# Patient Record
Sex: Female | Born: 1945 | Race: Black or African American | Hispanic: No | Smoking: Never smoker
Health system: Southern US, Community
[De-identification: ages and names within clinical notes are randomized; demographics above are authoritative.]

## PROBLEM LIST (undated history)

## (undated) DIAGNOSIS — B351 Tinea unguium: Secondary | ICD-10-CM

## (undated) DIAGNOSIS — K449 Diaphragmatic hernia without obstruction or gangrene: Secondary | ICD-10-CM

## (undated) DIAGNOSIS — E559 Vitamin D deficiency, unspecified: Secondary | ICD-10-CM

## (undated) DIAGNOSIS — E785 Hyperlipidemia, unspecified: Secondary | ICD-10-CM

## (undated) DIAGNOSIS — E049 Nontoxic goiter, unspecified: Secondary | ICD-10-CM

## (undated) DIAGNOSIS — M545 Low back pain, unspecified: Secondary | ICD-10-CM

## (undated) DIAGNOSIS — Z283 Underimmunization status: Secondary | ICD-10-CM

## (undated) DIAGNOSIS — I10 Essential (primary) hypertension: Secondary | ICD-10-CM

## (undated) DIAGNOSIS — L989 Disorder of the skin and subcutaneous tissue, unspecified: Secondary | ICD-10-CM

## (undated) DIAGNOSIS — Z6827 Body mass index (BMI) 27.0-27.9, adult: Secondary | ICD-10-CM

## (undated) DIAGNOSIS — R079 Chest pain, unspecified: Secondary | ICD-10-CM

## (undated) DIAGNOSIS — K219 Gastro-esophageal reflux disease without esophagitis: Secondary | ICD-10-CM

## (undated) DIAGNOSIS — Z2839 Other underimmunization status: Secondary | ICD-10-CM

## (undated) HISTORY — DX: Essential (primary) hypertension: I10

## (undated) HISTORY — DX: Gastro-esophageal reflux disease without esophagitis: K21.9

## (undated) HISTORY — DX: Low back pain: M54.5

## (undated) HISTORY — DX: Other underimmunization status: Z28.39

## (undated) HISTORY — DX: Hyperlipidemia, unspecified: E78.5

## (undated) HISTORY — DX: Diaphragmatic hernia without obstruction or gangrene: K44.9

## (undated) HISTORY — DX: Low back pain, unspecified: M54.50

## (undated) HISTORY — DX: Disorder of the skin and subcutaneous tissue, unspecified: L98.9

## (undated) HISTORY — DX: Tinea unguium: B35.1

## (undated) HISTORY — DX: Underimmunization status: Z28.3

## (undated) HISTORY — PX: OTHER SURGICAL HISTORY: SHX169

## (undated) HISTORY — DX: Chest pain, unspecified: R07.9

## (undated) HISTORY — DX: Nontoxic goiter, unspecified: E04.9

## (undated) HISTORY — PX: GALLBLADDER SURGERY: SHX652

## (undated) HISTORY — DX: Vitamin D deficiency, unspecified: E55.9

## (undated) HISTORY — DX: Body mass index (BMI) 27.0-27.9, adult: Z68.27

---

## 1997-11-05 ENCOUNTER — Ambulatory Visit (HOSPITAL_COMMUNITY): Admission: RE | Admit: 1997-11-05 | Discharge: 1997-11-05 | Payer: Self-pay | Admitting: Internal Medicine

## 1998-01-03 ENCOUNTER — Ambulatory Visit (HOSPITAL_COMMUNITY): Admission: RE | Admit: 1998-01-03 | Discharge: 1998-01-03 | Payer: Self-pay | Admitting: Gastroenterology

## 1998-02-27 ENCOUNTER — Other Ambulatory Visit: Admission: RE | Admit: 1998-02-27 | Discharge: 1998-02-27 | Payer: Self-pay | Admitting: *Deleted

## 1998-03-14 ENCOUNTER — Ambulatory Visit (HOSPITAL_COMMUNITY): Admission: RE | Admit: 1998-03-14 | Discharge: 1998-03-14 | Payer: Self-pay | Admitting: *Deleted

## 1998-03-14 ENCOUNTER — Encounter: Payer: Self-pay | Admitting: *Deleted

## 1998-03-27 ENCOUNTER — Encounter: Payer: Self-pay | Admitting: Internal Medicine

## 1998-03-27 ENCOUNTER — Ambulatory Visit (HOSPITAL_COMMUNITY): Admission: RE | Admit: 1998-03-27 | Discharge: 1998-03-27 | Payer: Self-pay | Admitting: Internal Medicine

## 1998-10-25 ENCOUNTER — Encounter: Payer: Self-pay | Admitting: Internal Medicine

## 1998-10-25 ENCOUNTER — Ambulatory Visit (HOSPITAL_COMMUNITY): Admission: RE | Admit: 1998-10-25 | Discharge: 1998-10-25 | Payer: Self-pay | Admitting: Internal Medicine

## 1999-01-24 ENCOUNTER — Ambulatory Visit (HOSPITAL_COMMUNITY): Admission: RE | Admit: 1999-01-24 | Discharge: 1999-01-24 | Payer: Self-pay | Admitting: Internal Medicine

## 1999-01-24 ENCOUNTER — Encounter: Payer: Self-pay | Admitting: Internal Medicine

## 1999-02-20 ENCOUNTER — Encounter (HOSPITAL_BASED_OUTPATIENT_CLINIC_OR_DEPARTMENT_OTHER): Payer: Self-pay | Admitting: General Surgery

## 1999-02-24 ENCOUNTER — Encounter (HOSPITAL_BASED_OUTPATIENT_CLINIC_OR_DEPARTMENT_OTHER): Payer: Self-pay | Admitting: General Surgery

## 1999-02-24 ENCOUNTER — Encounter (INDEPENDENT_AMBULATORY_CARE_PROVIDER_SITE_OTHER): Payer: Self-pay | Admitting: *Deleted

## 1999-02-25 ENCOUNTER — Inpatient Hospital Stay (HOSPITAL_COMMUNITY): Admission: RE | Admit: 1999-02-25 | Discharge: 1999-02-26 | Payer: Self-pay | Admitting: General Surgery

## 1999-02-25 ENCOUNTER — Encounter (HOSPITAL_BASED_OUTPATIENT_CLINIC_OR_DEPARTMENT_OTHER): Payer: Self-pay | Admitting: General Surgery

## 1999-03-10 ENCOUNTER — Other Ambulatory Visit: Admission: RE | Admit: 1999-03-10 | Discharge: 1999-03-10 | Payer: Self-pay | Admitting: *Deleted

## 1999-03-17 ENCOUNTER — Encounter: Admission: RE | Admit: 1999-03-17 | Discharge: 1999-03-17 | Payer: Self-pay | Admitting: Internal Medicine

## 1999-03-17 ENCOUNTER — Encounter: Payer: Self-pay | Admitting: Internal Medicine

## 1999-07-10 ENCOUNTER — Other Ambulatory Visit: Admission: RE | Admit: 1999-07-10 | Discharge: 1999-07-10 | Payer: Self-pay | Admitting: *Deleted

## 2000-03-19 ENCOUNTER — Encounter: Admission: RE | Admit: 2000-03-19 | Discharge: 2000-03-19 | Payer: Self-pay | Admitting: Internal Medicine

## 2000-03-19 ENCOUNTER — Encounter: Payer: Self-pay | Admitting: Internal Medicine

## 2000-07-01 ENCOUNTER — Other Ambulatory Visit: Admission: RE | Admit: 2000-07-01 | Discharge: 2000-07-01 | Payer: Self-pay | Admitting: *Deleted

## 2001-03-22 ENCOUNTER — Encounter: Payer: Self-pay | Admitting: *Deleted

## 2001-03-22 ENCOUNTER — Encounter: Admission: RE | Admit: 2001-03-22 | Discharge: 2001-03-22 | Payer: Self-pay | Admitting: *Deleted

## 2001-06-14 ENCOUNTER — Encounter: Admission: RE | Admit: 2001-06-14 | Discharge: 2001-06-14 | Payer: Self-pay | Admitting: Internal Medicine

## 2001-06-14 ENCOUNTER — Encounter: Payer: Self-pay | Admitting: Internal Medicine

## 2002-03-23 ENCOUNTER — Encounter: Admission: RE | Admit: 2002-03-23 | Discharge: 2002-03-23 | Payer: Self-pay

## 2002-10-26 ENCOUNTER — Inpatient Hospital Stay (HOSPITAL_COMMUNITY): Admission: EM | Admit: 2002-10-26 | Discharge: 2002-10-27 | Payer: Self-pay | Admitting: *Deleted

## 2002-10-26 ENCOUNTER — Encounter: Payer: Self-pay | Admitting: *Deleted

## 2002-10-27 ENCOUNTER — Encounter (INDEPENDENT_AMBULATORY_CARE_PROVIDER_SITE_OTHER): Payer: Self-pay | Admitting: Cardiology

## 2002-10-27 ENCOUNTER — Encounter: Payer: Self-pay | Admitting: Cardiology

## 2002-11-20 ENCOUNTER — Encounter: Payer: Self-pay | Admitting: *Deleted

## 2002-11-20 ENCOUNTER — Observation Stay (HOSPITAL_COMMUNITY): Admission: EM | Admit: 2002-11-20 | Discharge: 2002-11-21 | Payer: Self-pay | Admitting: *Deleted

## 2003-03-26 ENCOUNTER — Encounter: Admission: RE | Admit: 2003-03-26 | Discharge: 2003-03-26 | Payer: Self-pay | Admitting: Internal Medicine

## 2003-04-16 ENCOUNTER — Ambulatory Visit (HOSPITAL_COMMUNITY): Admission: RE | Admit: 2003-04-16 | Discharge: 2003-04-16 | Payer: Self-pay | Admitting: Gastroenterology

## 2003-09-24 ENCOUNTER — Other Ambulatory Visit: Admission: RE | Admit: 2003-09-24 | Discharge: 2003-09-24 | Payer: Self-pay | Admitting: Obstetrics and Gynecology

## 2004-04-03 ENCOUNTER — Encounter: Admission: RE | Admit: 2004-04-03 | Discharge: 2004-04-03 | Payer: Self-pay | Admitting: Internal Medicine

## 2004-09-30 ENCOUNTER — Other Ambulatory Visit: Admission: RE | Admit: 2004-09-30 | Discharge: 2004-09-30 | Payer: Self-pay | Admitting: Obstetrics and Gynecology

## 2004-10-21 ENCOUNTER — Encounter: Admission: RE | Admit: 2004-10-21 | Discharge: 2004-10-21 | Payer: Self-pay | Admitting: Internal Medicine

## 2005-04-06 ENCOUNTER — Encounter: Admission: RE | Admit: 2005-04-06 | Discharge: 2005-04-06 | Payer: Self-pay | Admitting: Internal Medicine

## 2005-08-15 ENCOUNTER — Emergency Department (HOSPITAL_COMMUNITY): Admission: EM | Admit: 2005-08-15 | Discharge: 2005-08-15 | Payer: Self-pay | Admitting: Emergency Medicine

## 2005-10-09 ENCOUNTER — Other Ambulatory Visit: Admission: RE | Admit: 2005-10-09 | Discharge: 2005-10-09 | Payer: Self-pay | Admitting: Obstetrics and Gynecology

## 2006-04-09 ENCOUNTER — Encounter: Admission: RE | Admit: 2006-04-09 | Discharge: 2006-04-09 | Payer: Self-pay | Admitting: Internal Medicine

## 2006-10-14 ENCOUNTER — Other Ambulatory Visit: Admission: RE | Admit: 2006-10-14 | Discharge: 2006-10-14 | Payer: Self-pay | Admitting: Obstetrics and Gynecology

## 2006-12-08 ENCOUNTER — Encounter: Admission: RE | Admit: 2006-12-08 | Discharge: 2006-12-08 | Payer: Self-pay | Admitting: Internal Medicine

## 2007-04-12 ENCOUNTER — Encounter: Admission: RE | Admit: 2007-04-12 | Discharge: 2007-04-12 | Payer: Self-pay | Admitting: Obstetrics and Gynecology

## 2007-10-26 ENCOUNTER — Other Ambulatory Visit: Admission: RE | Admit: 2007-10-26 | Discharge: 2007-10-26 | Payer: Self-pay | Admitting: Obstetrics and Gynecology

## 2007-12-05 ENCOUNTER — Encounter: Admission: RE | Admit: 2007-12-05 | Discharge: 2007-12-05 | Payer: Self-pay | Admitting: Internal Medicine

## 2007-12-15 ENCOUNTER — Ambulatory Visit (HOSPITAL_COMMUNITY): Admission: RE | Admit: 2007-12-15 | Discharge: 2007-12-15 | Payer: Self-pay | Admitting: Internal Medicine

## 2008-04-02 ENCOUNTER — Emergency Department (HOSPITAL_COMMUNITY): Admission: EM | Admit: 2008-04-02 | Discharge: 2008-04-02 | Payer: Self-pay | Admitting: Emergency Medicine

## 2008-04-17 ENCOUNTER — Encounter: Admission: RE | Admit: 2008-04-17 | Discharge: 2008-04-17 | Payer: Self-pay | Admitting: Internal Medicine

## 2008-10-23 ENCOUNTER — Other Ambulatory Visit: Admission: RE | Admit: 2008-10-23 | Discharge: 2008-10-23 | Payer: Self-pay | Admitting: Obstetrics and Gynecology

## 2009-04-23 ENCOUNTER — Encounter: Admission: RE | Admit: 2009-04-23 | Discharge: 2009-04-23 | Payer: Self-pay | Admitting: Internal Medicine

## 2009-08-21 ENCOUNTER — Encounter: Admission: RE | Admit: 2009-08-21 | Discharge: 2009-08-21 | Payer: Self-pay | Admitting: Internal Medicine

## 2009-10-02 ENCOUNTER — Ambulatory Visit (HOSPITAL_COMMUNITY): Admission: RE | Admit: 2009-10-02 | Discharge: 2009-10-02 | Payer: Self-pay | Admitting: Internal Medicine

## 2009-10-30 ENCOUNTER — Other Ambulatory Visit: Admission: RE | Admit: 2009-10-30 | Discharge: 2009-10-30 | Payer: Self-pay | Admitting: Obstetrics and Gynecology

## 2010-03-21 ENCOUNTER — Other Ambulatory Visit: Payer: Self-pay | Admitting: Internal Medicine

## 2010-03-21 ENCOUNTER — Ambulatory Visit
Admission: RE | Admit: 2010-03-21 | Discharge: 2010-03-21 | Disposition: A | Discharge status: Home / Self Care | Payer: BC Managed Care – PPO | Source: Ambulatory Visit | Attending: Internal Medicine | Admitting: Internal Medicine

## 2010-03-21 DIAGNOSIS — M25532 Pain in left wrist: Secondary | ICD-10-CM

## 2010-04-16 ENCOUNTER — Other Ambulatory Visit: Payer: Self-pay | Admitting: Internal Medicine

## 2010-04-16 DIAGNOSIS — Z1231 Encounter for screening mammogram for malignant neoplasm of breast: Secondary | ICD-10-CM

## 2010-05-07 ENCOUNTER — Ambulatory Visit
Admission: RE | Admit: 2010-05-07 | Discharge: 2010-05-07 | Disposition: A | Discharge status: Home / Self Care | Payer: BC Managed Care – PPO | Source: Ambulatory Visit | Attending: Internal Medicine | Admitting: Internal Medicine

## 2010-05-07 DIAGNOSIS — Z1231 Encounter for screening mammogram for malignant neoplasm of breast: Secondary | ICD-10-CM

## 2010-06-13 NOTE — Cardiovascular Report (Signed)
NAME:  [name], [name]                           ACCOUNT NO.:  [id number]   MEDICAL RECORD NO.:  [id number]                   PATIENT TYPE:  INP   LOCATION:  4727                                 FACILITY:  [facility]   PHYSICIAN:  Mohan N. Sharyn Lull, M.D.              DATE OF BIRTH:  1945-05-26   DATE OF PROCEDURE:  11/21/2002  DATE OF DISCHARGE:                              CARDIAC CATHETERIZATION   PROCEDURE:  Left cardiac catheterization with selective left and right  coronary angiography and left ventriculography via the right groin using  Judkins technique.   INDICATIONS:  [name] is a 65 year old black female with past medical  history significant for hypertension, history of hiatus hernia, history of  angina pectoris.  She came to the emergency room complaining of retrosternal  chest tightness grade 6/10 radiating to the left arm associated with mild  shortness of breath which woke her up around 1 a.m. yesterday morning.  Denies any nausea, vomiting, diaphoresis.  Denies palpitation,  lightheadedness, or syncope.  The patient had similar chest tightness  approximately one month ago requiring admission and subsequently had  Persantine Cardiolite which was negative for ischemia with normal EF.  The  patient was scheduled for left cath, possible percutaneous transluminal  coronary angioplasty and stenting as outpatient as the patient had recurrent  episodes of chest pain with minor T-wave inversion in anteroseptal and  lateral leads.  The patient was admitted to telemetry unit.  Myocardial  infarction was ruled out by serial enzymes and EKG.  Discussed with patient  regarding left catheterization, possible percutaneous transluminal coronary  angioplasty and stenting, its risks; i.e., death, myocardial infarction,  stroke, need for emergency coronary artery bypass graft, risk of restenosis,  local vascular complications, etc., and consented for the procedure.   PROCEDURE:  After  obtaining informed consent, the patient was brought to the  cath lab and was placed on fluoroscopy table. Right groin was prepped and  draped in usual fashion.  2% Xylocaine was used for local anesthesia in the  right groin.  With the help of thin-wall needle, a 6-French arterial sheath  was placed.  The sheath was aspirated and flushed.  Next, 6-French left  Judkins catheter was advanced over the wire under fluoroscopic guidance up  to the ascending aorta.  Wire was pulled out and the catheter was aspirated  and connected to the manifold. Catheter was further advanced and engaged  into left coronary ostium.  Multiple views of the left system were taken.  Next, the catheter was disengaged and was pulled out over the wire and was  replaced with 6-French right Judkins catheter which was advanced over the  wire under fluoroscopic guidance to the ascending aorta.  Wire was pulled  out, the catheter was aspirated and connected to the manifold.  Catheter was  further advanced and engaged into right coronary ostium.  Multiple views of  the right system were taken.  Next, the catheter was disengaged and was  pulled out over the wire and was replaced with 6-French pigtail catheter  which was advanced over wire under fluoroscopic guidance up to the ascending  aorta.  Wire was pulled out, the catheter was aspirated and connected to the  manifold.  Catheter was further advanced across the aortic valve into the  left ventricle.  Left ventricular pressures were recorded.  Next, left  ventriculography was done in 30-degree RAO position.  Post angiographic  pressures were recorded from LV and then pullback pressures were recorded  from the aorta.  There was no gradient across the aortic valve.  Next, the  pigtail catheter was pulled out over the wire, sheaths aspirated and  flushed.   FINDINGS:  The LV showed good LV systolic function with EF of 55-60%.  Left  main was patent.  LAD has 20% ostial  stenosis.  Diagonal one and diagonal  two were very small which were patent.  Left circumflex was patent.  OM-1 to  OM-3 were moderate size which were patent.  RCA has catheter induced ostial  spasm.  Otherwise, the  artery was patent.  Arteriotomy was closed with Perclose without  complications.  The patient tolerated procedure well.  The patient was  transferred to recovery room in stable condition.   PLAN:  Continue with calcium channel blockers and nitrates for possible  vasospasm.                                                Eduardo Osier. Sharyn Lull, M.D.    MNH/MEDQ  D:  11/21/2002  T:  11/21/2002  Job:  [id number]   cc:   Minerva Areola L. August Saucer, M.D.  [address] [address]  [place]  [place] [address]  Fax: [phone]

## 2010-06-13 NOTE — Discharge Summary (Signed)
NAME:  [name], [name]                           ACCOUNT NO.:  [id number]   MEDICAL RECORD NO.:  [id number]                   PATIENT TYPE:  INP   LOCATION:  4727                                 FACILITY:  [facility]   PHYSICIAN:  Mohan N. Sharyn Lull, M.D.              DATE OF BIRTH:  22-Feb-1945   DATE OF ADMISSION:  11/20/2002  DATE OF DISCHARGE:  11/21/2002                                 DISCHARGE SUMMARY   ADMISSION DIAGNOSES:  1. Unstable angina.  2. Hypertension.  3. Radiant angina, status post left catheterization.  4. Mild coronary artery disease, status post nonsustained ventricular     tachycardia, asymptomatic.  5. Status post hypokalemia.  6. Gastroesophageal reflux disease.   DISCHARGE MEDICATIONS:  1. Norvasc 5 mg 1 tablet q.d.  2. Imdur 60 mg 1 tablet q.d. in the morning.  3. Baby aspirin 81 mg 1 tablet q.d.  4. Nexium 40 mg 1 capsule daily half hour before breakfast.  5. Nitrostat 0.4 mg sublingual use as directed.  6. Multivitamin 1 tablet q.d.   ACTIVITY:  Avoid heavy lifting, pulling, or pushing for 48 hours.   DIET:  Low salt and low cholesterol.   DISCHARGE INSTRUCTIONS:  Postcardiac catheterization and Perclose  instructions have been given.   FOLLOW UP:  Follow up with me in one week and Dr. Minerva Areola L. Dean as scheduled.   CONDITION ON DISCHARGE:  Stable.   HISTORY OF PRESENT ILLNESS:  Ms. [name] is a 65 year old black female  with past medical history significant for hypertension, hiatal hernia,  angina pectoris.  She came to the ER complaining of retrosternal chest  tightness grade 6/10 radiating to the left arm, associated with mild  shortness of breath which woke her up around 1 a.m.  Denies any nausea,  vomiting, or diaphoresis.  Denies palpitations, lightheadedness, or syncope.  The patient had similar chest pain approximately one month ago requiring  admission and subsequently had Persantine-Cardiolite that was negative for  ischemia with normal  EF.  The patient was scheduled for left catheterization  with possible PTCA stenting as outpatient as the patient had recurrent  episodes of chest pain which responded to sublingual nitroglycerin and had  minor motion in anteroseptal leads and lateral leads in the past.   PAST MEDICAL HISTORY:  As above.   PAST SURGICAL HISTORY:  1. Cholecystectomy in 2001.  2. Bone spur surgery in 1970s and 2000.   ALLERGIES:  No known drug allergies.   MEDICATIONS:  1. She is on Norvasc 5 mg p.o. q.d.  2. Imdur 60 mg p.o. q.d.  3. Baby aspirin 81 mg q.d.  4. Nitrostat 0.4 mg sublingual p.r.n.   SOCIAL HISTORY:  She is married.  No children.  No history of smoking nor  alcohol abuse.  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   FAMILY HISTORY:  Father died of Parkinson's disease at  the age of 14.  Mother died of CA of liver.  One brother has hypertension.  One brother died  of accidental death.  One sister died with cancer.   PHYSICAL EXAMINATION:  GENERAL:  She is alert, awake, and oriented x 3 and  in no acute distress.  VITAL SIGNS:  Blood pressure was 134/76, pulse was 62 and regular.  HEENT:  Conjunctivae was pink.  NECK:  Supple.  No JVD and no bruit.  LUNGS:  Clear to auscultation without rhonchi.  CARDIOVASCULAR:  S1 and S2 was normal.  There was no S3 gallop.  ABDOMEN:  Soft.  Bowel sounds were present and nontender.  EXTREMITIES:  There was no clubbing, cyanosis, or edema.   LABORATORY DATA:  Her potassium was 3.3, BUN was 7, creatinine 0.8.  Hemoglobin was 15, hematocrit 43.  CPK-MB was 1.2, less than 1.0, and less  than 1.0.  Myoglobin was 86.6, 47.1, and 35.9.  Troponin I was less than  0.05 x 3.  Electrolyte panel is pending.  C-reactive protein is also  pending.   HOSPITAL COURSE:  The patient was admitted to the telemetry unit.  Was  started on IV heparin and nitrates.  MI was ruled out by serial enzymes and  EKG.  EKG showed normal sinus rhythm with nonspecific T-wave  abnormalities.  The patient had one episode of chest pressure which responded to sublingual  nitroglycerin.  EKG done during his chest pressure showed no evidence of  acute ischemic changes.  The patient also had six beats of nonsustained VT  and two episodes of up beats.  The patient was noted to have potassium of  3.3 which was replaced earlier this a.m.   The patient did not have any further episodes of ventricular tachycardia or  abnormal platelets but had episodes of bradycardia and a heart rate in the  50s.   Due to recurrent typical anginal chest pain and multiple risk factors, the  patient underwent left cardiac catheterization today which showed no  evidence of significant coronary artery disease.  The patient tolerated the  procedure well. There were no complications postprocedure.  Her groin is  stable. There is no evidence of hematoma or bruit.  The patient is  ambulating in the hallway this evening.  If her groin is stable, she will be  discharged home this evening.                                                 Eduardo Osier. Sharyn Lull, M.D.    MNH/MEDQ  D:  11/21/2002  T:  11/21/2002  Job:  [id number]   cc:   Minerva Areola L. August Saucer, M.D.  [address] [address]  [place]  [place] [address]  Fax: [phone]

## 2010-06-13 NOTE — Procedures (Signed)
[place]. [facility]  Patient:    [name]                      MRN: [id number] Proc. Date: 02/25/99 Adm. Date:  [id number] Attending:  Sonda Primes CC:         Mardene Celeste. Lurene Shadow, M.D.                           Procedure Report  PROCEDURE:  Endoscopic retrograde cholangiography with biliary sphincterotomy and common bile duct stone extraction.  INDICATIONS:  Retained common bile duct stone post laparoscopic cholecystectomy.  HISTORY:  This is a pleasant 65 year old female, who underwent laparoscopic cholecystectomy yesterday for chronic calculus cholecystitis.  Intraoperative cholangiogram demonstrated common bile duct stone.  She is now for ERCP with sphincterotomy and stone extraction.  The nature of the procedure, as well as, he risks, benefits and alternatives were discussed in detail.  She understood and agreed to proceed.  PHYSICAL EXAMINATION:  GENERAL:  Well-appearing female in no acute distress.  She is alert and oriented. Vital signs are stable.  LUNGS: clear.  HEART: regular.  ABDOMEN: soft with slight peri-incisional tenderness.  PROCEDURE:  After informed consent was obtained, patient was sedated with 75 mg of Demerol and 7 mg of Versed IV, Glucagon 0.5 mg IV was given as a duodenal relaxant. Unasyn 1.5 g IV was given preprocedurally.  The Olympus side-viewing duodenoscope was passed blindly into the esophagus.  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  The stomach revealed a sliding hiatal hernia, but was otherwise normal.  The duodenal bulb and postbulbar duodenum were normal.  The major papilla was normal.  The minor papilla was not sought.  X-RAY FINDINGS: 1. Scott radiograph of the abdomen with the endoscope in position revealed surgical clips.  No other abnormalities. 2. The common bile duct was selectively and deeply cannulated.  Contrast injection yielded complete filling of the  biliary tree.  There was no evidence of dilation, stricture or other anatomic abnormality.  However, a 6 mm stone was identified n the distal common bile duct.  THERAPY:  Hydrophilic wire was placed in the proximal biliary tree.  Over the wire, a standard biliary sphincterotomy was performed with cutting in the 12 oclock orientation.  Sphincterotomy size was deemed moderate.  The cutting catheter was exchanged for an 8.5 mm balloon.  The stone was extracted with the balloon. Post extraction occlusion cholangiogram was negative for residual filling defects. Drainage was excellent.  IMPRESSION:  Choledocholithiasis, status post ERC, with biliary sphincterotomy nd common duct stone extraction.  RECOMMENDATIONS:  Standard postprocedure orders with anticipated discharge tomorrow. DD:  02/25/99 TD:  02/25/99 Job: [id number]

## 2010-06-13 NOTE — Discharge Summary (Signed)
NAME:  [name], [name]                           ACCOUNT NO.:  [id number]   MEDICAL RECORD NO.:  [id number]                   PATIENT TYPE:  INP   LOCATION:  4715                                 FACILITY:  [facility]   PHYSICIAN:  Osvaldo Shipper. Spruill, M.D.             DATE OF BIRTH:  03-17-1945   DATE OF ADMISSION:  10/26/2002  DATE OF DISCHARGE:  10/27/2002                                 DISCHARGE SUMMARY   DISCHARGE DIAGNOSES:  1. Intermediate coronary syndrome.  2. Diaphragmatic hernia.  3. Hypertension.   HISTORY OF PRESENT ILLNESS:  Ms. Schwenke is a 65 year old patient who  presented initially to the emergency department of the [facility],  at which time she described a problem with chest tightness.  She gave a  history that this had been going on for about a week.  She described the  pain as being in the center of her chest and feeling like a tightness being  present and there was some shortness of breath noted.  The pain was worse  when lying down.  She took three nitroglycerin at home with some relief, but  the pain then returned and she presented to the emergency department for  further evaluation of this particular problem.  In the emergency department,  she was noted to have excellent vital signs and her potassium slightly low  at 3.3.  Her complete blood count was within normal limits.  The patient was  placed on IV nitroglycerin and heparin and seemed to show some improvement  after this medication and she was subsequently admitted with unstable  angina.   HOSPITAL COURSE:  The patient was admitted to the medical service; she was  placed on telemetry.  She was seen by pharmacy for heparin protocol.  On  October 26, 2002, the patient's heart rate dropped into the 40s and 50s;  orders were given to hold her Lopressor; this seemed to show some  improvement.  The patient continued to show improvement.  On October 27, 2002, the chest pain had improved significantly.   The patient had a chest x-  ray which revealed some blunting of the lateral costophrenic angles,  suggesting small bilateral pleural effusions, and borderline heart size.  Serial cardiac enzymes were negative.  The patient's cholesterol was 173,  within normal limits; triglycerides were also normal at 63.  The HDL was 66,  the LDL was 94, both within good range.  Two-dimensional echocardiogram was  obtained for this patient and after she was pain-free, able to ambulate with  minimal problems, plans were made for the patient to be discharged home and  have an outpatient Cardiolite study to continue her workup as an outpatient.   The patient's electrocardiogram revealed a sinus bradycardia and there were  no significant major or acute changes noted on that and the patient was  subsequently discharged home on October 27, 2002, having  received maximum  benefit from this hospitalization.   DISCHARGE MEDICATIONS:  She is to continue her current home medications.   FOLLOWUP:  She is to call the office to arrange completion of her workup as  an outpatient.  She is to notify a physician if any changes, problems or  concerns.     Ivery Quale, P.A.                       Osvaldo Shipper. Spruill, M.D.   HB/MEDQ  D:  11/08/2002  T:  11/09/2002  Job:  [id number]

## 2010-06-13 NOTE — Op Note (Signed)
[place]. [facility]  Patient:    [name]                      MRN: [id number] Proc. Date: 02/24/99 Adm. Date:  [id number] Attending:  Sonda Primes CC:         Mardene Celeste. Lurene Shadow, M.D. x 2             Lind Guest. August Saucer, M.D.                           Operative Report  PREOPERATIVE DIAGNOSIS:  Chronic calculus, cholecystitis.  POSTOPERATIVE DIAGNOSIS:  Chronic calculus, cholecystitis, choledocholithiasis.  PROCEDURE:  Laparoscopic cholecystectomy with operative cholangiogram.  SURGEON:  Luisa Hart L. Lurene Shadow, M.D.  ASSISTANT:  Marnee Spring. Wiliam Ke, M.D.  SECOND ASSISTANT:  Damita Lack, PA-student.  ANESTHESIA:  General.  INDICATIONS:  Patient is a 65 year old woman presenting with a upper abdominal ain associated with some postprandial nausea and vomiting.  Liver function studies re within normal limits.  She did have a mildly elevated lipase.  She is brought to the operating room now for laparoscopic cholecystectomy.  PROCEDURE:  Following the induction of anesthesia, the patient positioned supinely and the abdomen routinely prepped and draped to be included in a sterile operative field.  Open laparoscopy created at the umbilicus with the insertion of the Hasson cannula and insufflation of the peritoneal cavity to 14 mmHg pressure using carbon dioxide.  Camera inserted, visual exploration of the abdomen showed a very large, but nondistended gallbladder.  Liver edges were sharp, liver surface is smooth.  None of the small or large intestine viewed appeared to be abnormal.  The pelvic organs were not visualized.  Under direct vision, epigastric and lateral ports re placed.  The gallbladder is grasped and retracted cephalad.  Multiple adhesions to the wall of the gallbladder were taken down, carrying the dissection down to the region of the ampulla.  Here, we dissected out the cystic artery and cystic duct. The cystic duct was very large  in size and this was carried down and the cystic  duct/common duct junction was identified, as was the cystic duct/gallbladder junction.  The course of the cystic artery was followed up to its entry into the gallbladder wall.  Cystic artery was doubly clipped and transected.  Cystic duct was then clipped proximally and opened.  A cystic duct cholangiogram carried out with one-half strength Hypaque was then done showing a dilated biliary system with what appeared to be two mid common duct stones and a stone inspissated within the cystic duct.  We attempted using a Fogarty catheter to dislodge the cystic duct  stone.  This was not successful.  We however were able to place two clips and an Endoloop what I think was proximal to the stone.  No attempt was made to remove the common duct stone.  The patient will be referred for ERCP/ERS with stone extraction.  At the end of this dissection, the cystic duct was then transected and the gallbladder dissected free from the liver bed using electrocautery and maintaining hemostasis throughout the course of dissection.  The liver bed was hen thoroughly inspected for bleeding points.  All additional bleeding point treated with electrocautery.  Right upper quadrant thoroughly irrigated with normal saline.  The camera was then moved to the epigastric port, the gallbladder retrieved through the umbilical port without difficulty.  Sponge, instrument and sharp counts were  verified.  Pneumoperitoneum deflated after the trocars were removed under direct vision.  The wounds were then closed in layers as follow: umbilical wound closed in two layers with 0 Dexon and 4-0 Dexon, epigastric and lateral flank wounds closed with 4-0 Dexon sutures then reinforced with Steri-Strips and sterile dressing are applied.  The anesthetic reversed.  Patient removed from the operating room to the recovery room in stable condition having tolerated the  procedure well. DD:  02/24/99 TD:  02/24/99 Job: [id number]

## 2010-06-13 NOTE — Op Note (Signed)
NAME:  [name], [name]                           ACCOUNT NO.:  [id number]   MEDICAL RECORD NO.:  [id number]                   PATIENT TYPE:  AMB   LOCATION:  ENDO                                 FACILITY:  [facility]   PHYSICIAN:  Anselmo Rod, M.D.               DATE OF BIRTH:  09-Apr-1945   DATE OF PROCEDURE:  04/16/2003  DATE OF DISCHARGE:  04/16/2003                                 OPERATIVE REPORT   PROCEDURE:  Screening colonoscopy.   ENDOSCOPIST:  Anselmo Rod, M.D.   INSTRUMENT USED:  Olympus video colonoscope.   INDICATIONS FOR PROCEDURE:  [age]-year-old African-American female  undergoing screening colonoscopy.  The patient has several family members  with colon cancer.  Rule out colonic polyps, masses, etc.   PRE-PROCEDURE PREPARATION:  Informed consent was procured from the patient.  The patient fasted for eight hours prior to the procedure and prepped with a  bottle of magnesium citrate and a gallon of GoLYTELY the night prior to the  procedure.   PRE-PROCEDURE PHYSICAL:  VITAL SIGNS:  Stable.  NECK:  Supple.  CHEST:  Clear to auscultation.  CARDIOVASCULAR:  S1 and S2 are regular.  ABDOMEN:  Soft with normal bowel sounds.   DESCRIPTION OF PROCEDURE:  The patient was placed in the left lateral  decubitus position and sedated with 75 mg of Demerol and 5 mg of Versed  intravenously.  Once the patient was adequately sedated and maintained on  low flow oxygen and continuous cardiac monitoring the Olympus video  colonoscope was advanced from the rectum to the cecum.  The appendiceal  orifice and the ileocecal valve were clearly visualized and photographed.  No masses, polyps, erosions, ulcerations or diverticula were seen.  Retroflexion in the rectum revealed no abnormalities.   IMPRESSION:  Normal colonoscopy to the cecum.   RECOMMENDATIONS:  1. Repeat colonoscopy screening is recommended in the next five years unless     the patient develops any abdominal  symptoms in the interim.  2. Continue a higher fiber diet with liberal fluid intake.  3. Outpatient follow-up if the need arises in the future.                                               Anselmo Rod, M.D.    JNM/MEDQ  D:  04/18/2003  T:  04/19/2003  Job:  [id number]   cc:   Minerva Areola L. August Saucer, M.D.  [address] [address]  [place]  [place] [address]  Fax: [phone]

## 2010-09-16 ENCOUNTER — Other Ambulatory Visit: Payer: Self-pay | Admitting: Internal Medicine

## 2010-09-16 DIAGNOSIS — E041 Nontoxic single thyroid nodule: Secondary | ICD-10-CM

## 2010-09-16 DIAGNOSIS — E049 Nontoxic goiter, unspecified: Secondary | ICD-10-CM

## 2010-09-23 ENCOUNTER — Ambulatory Visit
Admission: RE | Admit: 2010-09-23 | Discharge: 2010-09-23 | Disposition: A | Discharge status: Home / Self Care | Payer: BC Managed Care – PPO | Source: Ambulatory Visit | Attending: Internal Medicine | Admitting: Internal Medicine

## 2010-09-23 DIAGNOSIS — E049 Nontoxic goiter, unspecified: Secondary | ICD-10-CM

## 2010-10-01 ENCOUNTER — Other Ambulatory Visit: Payer: Self-pay | Admitting: Internal Medicine

## 2010-10-01 DIAGNOSIS — E041 Nontoxic single thyroid nodule: Secondary | ICD-10-CM

## 2010-10-02 ENCOUNTER — Other Ambulatory Visit (HOSPITAL_COMMUNITY)
Admission: RE | Admit: 2010-10-02 | Discharge: 2010-10-02 | Disposition: A | Discharge status: Home / Self Care | Payer: BC Managed Care – PPO | Source: Ambulatory Visit | Attending: Physician Assistant | Admitting: Physician Assistant

## 2010-10-02 ENCOUNTER — Ambulatory Visit
Admission: RE | Admit: 2010-10-02 | Discharge: 2010-10-02 | Disposition: A | Discharge status: Home / Self Care | Payer: BC Managed Care – PPO | Source: Ambulatory Visit | Attending: Internal Medicine | Admitting: Internal Medicine

## 2010-10-02 DIAGNOSIS — E041 Nontoxic single thyroid nodule: Secondary | ICD-10-CM

## 2010-10-02 DIAGNOSIS — E049 Nontoxic goiter, unspecified: Secondary | ICD-10-CM | POA: Insufficient documentation

## 2010-11-05 ENCOUNTER — Other Ambulatory Visit: Payer: Self-pay | Admitting: Obstetrics and Gynecology

## 2010-11-05 ENCOUNTER — Other Ambulatory Visit (HOSPITAL_COMMUNITY)
Admission: RE | Admit: 2010-11-05 | Discharge: 2010-11-05 | Disposition: A | Discharge status: Home / Self Care | Payer: BC Managed Care – PPO | Source: Ambulatory Visit | Attending: Obstetrics and Gynecology | Admitting: Obstetrics and Gynecology

## 2010-11-05 DIAGNOSIS — Z01419 Encounter for gynecological examination (general) (routine) without abnormal findings: Secondary | ICD-10-CM | POA: Insufficient documentation

## 2011-04-20 ENCOUNTER — Other Ambulatory Visit: Payer: Self-pay | Admitting: Internal Medicine

## 2011-04-20 DIAGNOSIS — Z1231 Encounter for screening mammogram for malignant neoplasm of breast: Secondary | ICD-10-CM

## 2011-05-12 ENCOUNTER — Ambulatory Visit
Admission: RE | Admit: 2011-05-12 | Discharge: 2011-05-12 | Disposition: A | Discharge status: Home / Self Care | Payer: BC Managed Care – PPO | Source: Ambulatory Visit | Attending: Internal Medicine | Admitting: Internal Medicine

## 2011-05-12 DIAGNOSIS — Z1231 Encounter for screening mammogram for malignant neoplasm of breast: Secondary | ICD-10-CM

## 2011-05-28 ENCOUNTER — Other Ambulatory Visit: Payer: Self-pay | Admitting: Internal Medicine

## 2011-06-02 ENCOUNTER — Ambulatory Visit
Admission: RE | Admit: 2011-06-02 | Discharge: 2011-06-02 | Disposition: A | Discharge status: Home / Self Care | Payer: BC Managed Care – PPO | Source: Ambulatory Visit | Attending: Internal Medicine | Admitting: Internal Medicine

## 2011-11-05 LAB — BASIC METABOLIC PANEL
Creatinine: 0.8 mg/dL (ref 0.5–1.1)
Glucose: 88 mg/dL
Potassium: 3.9 mmol/L (ref 3.4–5.3)
Sodium: 139 mmol/L (ref 137–147)

## 2011-11-05 LAB — LIPID PANEL: HDL: 57 mg/dL (ref 35–70)

## 2011-11-05 LAB — HEPATIC FUNCTION PANEL: AST: 18 U/L (ref 13–35)

## 2011-11-19 ENCOUNTER — Other Ambulatory Visit (HOSPITAL_COMMUNITY)
Admission: RE | Admit: 2011-11-19 | Discharge: 2011-11-19 | Disposition: A | Discharge status: Home / Self Care | Payer: Medicare Other | Source: Ambulatory Visit | Attending: Obstetrics and Gynecology | Admitting: Obstetrics and Gynecology

## 2011-11-19 ENCOUNTER — Other Ambulatory Visit: Payer: Self-pay | Admitting: Obstetrics and Gynecology

## 2011-11-19 DIAGNOSIS — Z1151 Encounter for screening for human papillomavirus (HPV): Secondary | ICD-10-CM | POA: Insufficient documentation

## 2011-11-19 DIAGNOSIS — Z124 Encounter for screening for malignant neoplasm of cervix: Secondary | ICD-10-CM | POA: Insufficient documentation

## 2012-02-26 ENCOUNTER — Encounter (HOSPITAL_COMMUNITY): Payer: Self-pay | Admitting: General Practice

## 2012-04-27 ENCOUNTER — Other Ambulatory Visit: Payer: Self-pay

## 2012-04-27 DIAGNOSIS — Z1231 Encounter for screening mammogram for malignant neoplasm of breast: Secondary | ICD-10-CM

## 2012-05-27 ENCOUNTER — Ambulatory Visit
Admission: RE | Admit: 2012-05-27 | Discharge: 2012-05-27 | Disposition: A | Discharge status: Home / Self Care | Payer: 59 | Source: Ambulatory Visit

## 2012-05-27 DIAGNOSIS — Z1231 Encounter for screening mammogram for malignant neoplasm of breast: Secondary | ICD-10-CM

## 2013-02-22 ENCOUNTER — Observation Stay (HOSPITAL_COMMUNITY)
Admission: EM | Admit: 2013-02-22 | Discharge: 2013-02-23 | Disposition: A | Discharge status: Home / Self Care | Payer: Medicare Other | Attending: Cardiology | Admitting: Cardiology

## 2013-02-22 ENCOUNTER — Emergency Department (HOSPITAL_COMMUNITY): Payer: Medicare Other

## 2013-02-22 ENCOUNTER — Encounter (HOSPITAL_COMMUNITY): Payer: Self-pay | Admitting: Emergency Medicine

## 2013-02-22 DIAGNOSIS — R079 Chest pain, unspecified: Secondary | ICD-10-CM

## 2013-02-22 DIAGNOSIS — E785 Hyperlipidemia, unspecified: Secondary | ICD-10-CM | POA: Insufficient documentation

## 2013-02-22 DIAGNOSIS — Z8619 Personal history of other infectious and parasitic diseases: Secondary | ICD-10-CM | POA: Insufficient documentation

## 2013-02-22 DIAGNOSIS — I10 Essential (primary) hypertension: Secondary | ICD-10-CM | POA: Insufficient documentation

## 2013-02-22 DIAGNOSIS — Z7982 Long term (current) use of aspirin: Secondary | ICD-10-CM | POA: Insufficient documentation

## 2013-02-22 DIAGNOSIS — E049 Nontoxic goiter, unspecified: Secondary | ICD-10-CM | POA: Insufficient documentation

## 2013-02-22 DIAGNOSIS — Z79899 Other long term (current) drug therapy: Secondary | ICD-10-CM | POA: Insufficient documentation

## 2013-02-22 DIAGNOSIS — R0789 Other chest pain: Principal | ICD-10-CM | POA: Diagnosis present

## 2013-02-22 DIAGNOSIS — K219 Gastro-esophageal reflux disease without esophagitis: Secondary | ICD-10-CM | POA: Insufficient documentation

## 2013-02-22 LAB — CBC WITH DIFFERENTIAL/PLATELET
BASOS ABS: 0 10*3/uL (ref 0.0–0.1)
BASOS PCT: 0 % (ref 0–1)
Basophils Absolute: 0 10*3/uL (ref 0.0–0.1)
Basophils Relative: 0 % (ref 0–1)
Eosinophils Absolute: 0.1 10*3/uL (ref 0.0–0.7)
Eosinophils Absolute: 0.1 10*3/uL (ref 0.0–0.7)
Eosinophils Relative: 2 % (ref 0–5)
Eosinophils Relative: 2 % (ref 0–5)
HEMATOCRIT: 42.2 % (ref 36.0–46.0)
HEMATOCRIT: 37.8 % (ref 36.0–46.0)
Hemoglobin: 14.9 g/dL (ref 12.0–15.0)
Hemoglobin: 13 g/dL (ref 12.0–15.0)
LYMPHS PCT: 34 % (ref 12–46)
Lymphocytes Relative: 30 % (ref 12–46)
Lymphs Abs: 1.5 10*3/uL (ref 0.7–4.0)
Lymphs Abs: 2.1 10*3/uL (ref 0.7–4.0)
MCH: 30.3 pg (ref 26.0–34.0)
MCH: 30 pg (ref 26.0–34.0)
MCHC: 35.3 g/dL (ref 30.0–36.0)
MCHC: 34.4 g/dL (ref 30.0–36.0)
MCV: 85.9 fL (ref 78.0–100.0)
MCV: 87.3 fL (ref 78.0–100.0)
MONO ABS: 0.4 10*3/uL (ref 0.1–1.0)
MONO ABS: 0.4 10*3/uL (ref 0.1–1.0)
Monocytes Relative: 7 % (ref 3–12)
Monocytes Relative: 6 % (ref 3–12)
NEUTROS ABS: 3.1 10*3/uL (ref 1.7–7.7)
Neutro Abs: 3.6 10*3/uL (ref 1.7–7.7)
Neutrophils Relative %: 61 % (ref 43–77)
Neutrophils Relative %: 58 % (ref 43–77)
PLATELETS: 249 10*3/uL (ref 150–400)
Platelets: 241 10*3/uL (ref 150–400)
RBC: 4.91 MIL/uL (ref 3.87–5.11)
RBC: 4.33 MIL/uL (ref 3.87–5.11)
RDW: 12.6 % (ref 11.5–15.5)
RDW: 12.7 % (ref 11.5–15.5)
WBC: 5.1 10*3/uL (ref 4.0–10.5)
WBC: 6.2 10*3/uL (ref 4.0–10.5)

## 2013-02-22 LAB — COMPREHENSIVE METABOLIC PANEL
ALT: 13 U/L (ref 0–35)
AST: 20 U/L (ref 0–37)
Albumin: 3.5 g/dL (ref 3.5–5.2)
Alkaline Phosphatase: 53 U/L (ref 39–117)
BUN: 9 mg/dL (ref 6–23)
CHLORIDE: 100 meq/L (ref 96–112)
CO2: 28 meq/L (ref 19–32)
CREATININE: 0.89 mg/dL (ref 0.50–1.10)
Calcium: 9.1 mg/dL (ref 8.4–10.5)
GFR calc Af Amer: 77 mL/min — ABNORMAL LOW (ref >90)
GFR calc non Af Amer: 66 mL/min — ABNORMAL LOW (ref >90)
Glucose, Bld: 108 mg/dL — ABNORMAL HIGH (ref 70–99)
Potassium: 3.8 mEq/L (ref 3.7–5.3)
Sodium: 139 mEq/L (ref 137–147)
Total Bilirubin: 0.3 mg/dL (ref 0.3–1.2)
Total Protein: 7.2 g/dL (ref 6.0–8.3)

## 2013-02-22 LAB — BASIC METABOLIC PANEL
BUN: 8 mg/dL (ref 6–23)
CHLORIDE: 98 meq/L (ref 96–112)
CO2: 26 mEq/L (ref 19–32)
Calcium: 9.4 mg/dL (ref 8.4–10.5)
Creatinine, Ser: 0.92 mg/dL (ref 0.50–1.10)
GFR calc non Af Amer: 63 mL/min — ABNORMAL LOW (ref >90)
GFR, EST AFRICAN AMERICAN: 74 mL/min — AB (ref >90)
Glucose, Bld: 110 mg/dL — ABNORMAL HIGH (ref 70–99)
Potassium: 3.3 mEq/L — ABNORMAL LOW (ref 3.7–5.3)
Sodium: 139 mEq/L (ref 137–147)

## 2013-02-22 LAB — PROTIME-INR
INR: 1.1 (ref 0.00–1.49)
Prothrombin Time: 14 seconds (ref 11.6–15.2)

## 2013-02-22 LAB — PRO B NATRIURETIC PEPTIDE: Pro B Natriuretic peptide (BNP): 51.9 pg/mL (ref 0–125)

## 2013-02-22 LAB — D-DIMER, QUANTITATIVE: D-Dimer, Quant: 0.27 ug/mL-FEU (ref 0.00–0.48)

## 2013-02-22 LAB — TROPONIN I: Troponin I: <0.3 ng/mL (ref <0.30)

## 2013-02-22 LAB — MAGNESIUM: MAGNESIUM: 2.2 mg/dL (ref 1.5–2.5)

## 2013-02-22 MED ORDER — SODIUM CHLORIDE 0.9 % IV SOLN
INTRAVENOUS | Status: DC
  Administered 2013-02-22: 19:00:00 via INTRAVENOUS

## 2013-02-22 MED ORDER — MORPHINE SULFATE 2 MG/ML IJ SOLN: 2.0000 mg | INTRAMUSCULAR | Status: DC | PRN

## 2013-02-22 MED ORDER — ASPIRIN EC 81 MG PO TBEC
81.0000 mg | DELAYED_RELEASE_TABLET | Freq: Every day | ORAL | Status: DC
Start: 2013-02-23 — End: 2013-02-23
  Filled 2013-02-22 (×2): qty 1

## 2013-02-22 MED ORDER — NITROGLYCERIN 2 % TD OINT
1.0000 [in_us] | TOPICAL_OINTMENT | Freq: Once | TRANSDERMAL | Status: AC
  Administered 2013-02-22: 1 [in_us] via TOPICAL
  Filled 2013-02-22: qty 1

## 2013-02-22 MED ORDER — NITROGLYCERIN 2 % TD OINT
0.5000 [in_us] | TOPICAL_OINTMENT | Freq: Four times a day (QID) | TRANSDERMAL | Status: DC
  Administered 2013-02-22 – 2013-02-23 (×2): 0.5 [in_us] via TOPICAL
  Filled 2013-02-22: qty 30

## 2013-02-22 MED ORDER — HEPARIN BOLUS VIA INFUSION
4000.0000 [IU] | Freq: Once | INTRAVENOUS | Status: AC
  Administered 2013-02-22: 4000 [IU] via INTRAVENOUS
  Filled 2013-02-22: qty 4000

## 2013-02-22 MED ORDER — ASPIRIN 81 MG PO TABS: 81.0000 mg | ORAL_TABLET | ORAL | Status: DC

## 2013-02-22 MED ORDER — LEVOTHYROXINE SODIUM 25 MCG PO TABS
25.0000 ug | ORAL_TABLET | Freq: Every day | ORAL | Status: DC
  Filled 2013-02-22 (×3): qty 1

## 2013-02-22 MED ORDER — ATORVASTATIN CALCIUM 80 MG PO TABS
80.0000 mg | ORAL_TABLET | Freq: Every day | ORAL | Status: DC
  Filled 2013-02-22: qty 1

## 2013-02-22 MED ORDER — ASPIRIN 300 MG RE SUPP: 300.0000 mg | RECTAL | Status: DC

## 2013-02-22 MED ORDER — ASPIRIN 81 MG PO CHEW: 324.0000 mg | CHEWABLE_TABLET | ORAL | Status: DC

## 2013-02-22 MED ORDER — ONDANSETRON HCL 4 MG/2ML IJ SOLN: 4.0000 mg | Freq: Four times a day (QID) | INTRAMUSCULAR | Status: DC | PRN

## 2013-02-22 MED ORDER — HEPARIN (PORCINE) IN NACL 100-0.45 UNIT/ML-% IJ SOLN
1200.0000 [IU]/h | INTRAMUSCULAR | Status: DC
  Administered 2013-02-22: 1200 [IU]/h via INTRAVENOUS
  Filled 2013-02-22 (×3): qty 250

## 2013-02-22 MED ORDER — PANTOPRAZOLE SODIUM 40 MG PO TBEC: 40.0000 mg | DELAYED_RELEASE_TABLET | Freq: Every day | ORAL | Status: DC

## 2013-02-22 MED ORDER — NITROGLYCERIN 0.4 MG SL SUBL: 0.4000 mg | SUBLINGUAL_TABLET | SUBLINGUAL | Status: DC | PRN

## 2013-02-22 MED ORDER — NITROGLYCERIN 0.4 MG SL SUBL
0.4000 mg | SUBLINGUAL_TABLET | SUBLINGUAL | Status: AC
  Administered 2013-02-22 (×3): 0.4 mg via SUBLINGUAL

## 2013-02-22 MED ORDER — ASPIRIN 325 MG PO TABS
325.0000 mg | ORAL_TABLET | Freq: Once | ORAL | Status: AC
  Administered 2013-02-22: 325 mg via ORAL
  Filled 2013-02-22: qty 1

## 2013-02-22 MED ORDER — POTASSIUM CHLORIDE CRYS ER 20 MEQ PO TBCR
20.0000 meq | EXTENDED_RELEASE_TABLET | Freq: Every day | ORAL | Status: DC
  Administered 2013-02-22: 20 meq via ORAL
  Filled 2013-02-22 (×2): qty 1

## 2013-02-22 MED ORDER — METOPROLOL TARTRATE 12.5 MG HALF TABLET
12.5000 mg | ORAL_TABLET | Freq: Two times a day (BID) | ORAL | Status: DC
  Administered 2013-02-22: 12.5 mg via ORAL
  Filled 2013-02-22 (×3): qty 1

## 2013-02-22 MED ORDER — POTASSIUM CHLORIDE CRYS ER 20 MEQ PO TBCR
40.0000 meq | EXTENDED_RELEASE_TABLET | Freq: Once | ORAL | Status: AC
  Administered 2013-02-22: 40 meq via ORAL
  Filled 2013-02-22: qty 2

## 2013-02-22 MED ORDER — AMLODIPINE BESYLATE 5 MG PO TABS
5.0000 mg | ORAL_TABLET | Freq: Every day | ORAL | Status: DC
  Administered 2013-02-22: 5 mg via ORAL
  Filled 2013-02-22 (×2): qty 1

## 2013-02-22 MED ORDER — ACETAMINOPHEN 325 MG PO TABS: 650.0000 mg | ORAL_TABLET | ORAL | Status: DC | PRN

## 2013-02-22 NOTE — ED Notes (Signed)
Pt states she has a "fullness" in chest even after 3rd nitro.  MD aware.

## 2013-02-22 NOTE — ED Notes (Signed)
Pt reports no chest pain 

## 2013-02-22 NOTE — H&P (Signed)
[name] is an 68 y.o. female.   Chief Complaint: Chest pain HPI: Patient is 68 year old female with past medical history significant for mild coronary artery disease had cardiac cath approximately 11 years ago, hypertension, hypercholesteremia, hypothyroidism, GERD, hiatus hernia, came to the ER complaining of right-sided chest pain described S. discomfort/soreness grade 7/10 without associated nausea vomiting or diaphoresis. Patient received 3 sublingual nitroglycerin in ED with relief of chest pain. EKG done in the ER showed normal sinus rhythm with nonspecific ST-T wave changes. Patient denies any relation of chest pain to food but states occasionally increases with deep breathing. Denies any history of muscle trauma or heavy weightlifting. Denies any recent cough or cold but states had the flu approximately 4 weeks ago. Denies any positional chest pain.  Past Medical History  Diagnosis Date  . Hypertension   . Hyperlipidemia   . Esophageal reflux   . Onychomycosis   . Thyroid enlargement     History reviewed. No pertinent past surgical history.  No family history on file. Social History:  reports that she has never smoked. She does not have any smokeless tobacco history on file. She reports that she does not drink alcohol. Her drug history is not on file.  Allergies: No Known Allergies  Medications Prior to Admission  Medication Sig Dispense Refill  . amLODipine (NORVASC) 5 MG tablet Take 5 mg by mouth daily.      Marland Kitchen aspirin 81 MG tablet Take 81 mg by mouth every other day.      . cholecalciferol (VITAMIN D) 400 UNITS TABS Take 400 Units by mouth daily.      . Glucosamine-Chondroit-Vit C-Mn (GLUCOSAMINE-CHONDROITIN) CAPS Take 1 capsule by mouth daily.      Marland Kitchen levothyroxine (SYNTHROID, LEVOTHROID) 25 MCG tablet Take 25 mcg by mouth daily.      . Multiple Vitamin (MULTIVITAMIN) tablet Take 1 tablet by mouth daily.      . pantoprazole (PROTONIX) 40 MG tablet Take 40 mg by mouth  daily.      . potassium chloride SA (K-DUR,KLOR-CON) 20 MEQ tablet Take 20 mEq by mouth daily.      Marland Kitchen triamterene-hydrochlorothiazide (MAXZIDE-25) 37.5-25 MG per tablet Take 1 tablet by mouth daily.        Results for orders placed during the hospital encounter of 02/22/13 (from the past 48 hour(s))  CBC WITH DIFFERENTIAL     Status: None   Collection Time    02/22/13 12:53 PM      Result Value Range   WBC 5.1  4.0 - 10.5 K/uL   RBC 4.91  3.87 - 5.11 MIL/uL   Hemoglobin 14.9  12.0 - 15.0 g/dL   HCT 42.2  36.0 - 46.0 %   MCV 85.9  78.0 - 100.0 fL   MCH 30.3  26.0 - 34.0 pg   MCHC 35.3  30.0 - 36.0 g/dL   RDW 12.6  11.5 - 15.5 %   Platelets 249  150 - 400 K/uL   Neutrophils Relative % 61  43 - 77 %   Neutro Abs 3.1  1.7 - 7.7 K/uL   Lymphocytes Relative 30  12 - 46 %   Lymphs Abs 1.5  0.7 - 4.0 K/uL   Monocytes Relative 7  3 - 12 %   Monocytes Absolute 0.4  0.1 - 1.0 K/uL   Eosinophils Relative 2  0 - 5 %   Eosinophils Absolute 0.1  0.0 - 0.7 K/uL   Basophils Relative 0  0 -  1 %   Basophils Absolute 0.0  0.0 - 0.1 K/uL  BASIC METABOLIC PANEL     Status: Abnormal   Collection Time    02/22/13 12:53 PM      Result Value Range   Sodium 139  137 - 147 mEq/L   Potassium 3.3 (*) 3.7 - 5.3 mEq/L   Chloride 98  96 - 112 mEq/L   CO2 26  19 - 32 mEq/L   Glucose, Bld 110 (*) 70 - 99 mg/dL   BUN 8  6 - 23 mg/dL   Creatinine, Ser 0.92  0.50 - 1.10 mg/dL   Calcium 9.4  8.4 - 10.5 mg/dL   GFR calc non Af Amer 63 (*) >90 mL/min   GFR calc Af Amer 74 (*) >90 mL/min   Comment: (NOTE)     The eGFR has been calculated using the CKD EPI equation.     This calculation has not been validated in all clinical situations.     eGFR's persistently <90 mL/min signify possible Chronic Kidney     Disease.  TROPONIN I     Status: None   Collection Time    02/22/13 12:53 PM      Result Value Range   Troponin I <0.30  <0.30 ng/mL   Comment:            Due to the release kinetics of cTnI,     a  negative result within the first hours     of the onset of symptoms does not rule out     myocardial infarction with certainty.     If myocardial infarction is still suspected,     repeat the test at appropriate intervals.  PRO B NATRIURETIC PEPTIDE     Status: None   Collection Time    02/22/13 12:53 PM      Result Value Range   Pro B Natriuretic peptide (BNP) 51.9  0 - 125 pg/mL  D-DIMER, QUANTITATIVE     Status: None   Collection Time    02/22/13  1:05 PM      Result Value Range   D-Dimer, Quant 0.27  0.00 - 0.48 ug/mL-FEU   Comment:            AT THE INHOUSE ESTABLISHED CUTOFF     VALUE OF 0.48 ug/mL FEU,     THIS ASSAY HAS BEEN DOCUMENTED     IN THE LITERATURE TO HAVE     A SENSITIVITY AND NEGATIVE     PREDICTIVE VALUE OF AT LEAST     98 TO 99%.  THE TEST RESULT     SHOULD BE CORRELATED WITH     AN ASSESSMENT OF THE CLINICAL     PROBABILITY OF DVT / VTE.   Dg Chest 2 View  02/22/2013   CLINICAL DATA:  Chest pain and pressure.  EXAM: CHEST  2 VIEW  COMPARISON:  08/15/2005  FINDINGS: Two views the chest demonstrate clear lungs. Heart and mediastinum are within normal limits. Trachea is midline.  IMPRESSION: No active cardiopulmonary disease.   Electronically Signed   By: Markus Daft M.D.   On: 02/22/2013 13:55    Review of Systems  Constitutional: Negative for fever and chills.  Eyes: Negative for blurred vision, double vision and photophobia.  Cardiovascular: Positive for chest pain. Negative for palpitations, orthopnea and claudication.  Gastrointestinal: Negative for nausea, vomiting and abdominal pain.  Genitourinary: Negative for dysuria.  Neurological: Negative for dizziness and headaches.  Blood pressure 148/74, pulse 67, temperature 98 F (36.7 C), temperature source Oral, resp. rate 15, SpO2 99.00%. Physical Exam  Constitutional: She is oriented to person, place, and time.  HENT:  Head: Normocephalic and atraumatic.  Eyes: Conjunctivae are normal. Pupils are  equal, round, and reactive to light. Left eye exhibits no discharge. No scleral icterus.  Neck: Normal range of motion. Neck supple. No JVD present. No tracheal deviation present. No thyromegaly present.  Cardiovascular: Normal rate, regular rhythm and intact distal pulses.   Murmur (Soft systolic murmur noted no S3 gallop) heard. Respiratory: Effort normal and breath sounds normal. No respiratory distress. She has no wheezes. She has no rales.  GI: Soft. Bowel sounds are normal. She exhibits no distension. There is no tenderness. There is no rebound and no guarding.  Musculoskeletal: She exhibits no edema and no tenderness.  Neurological: She is alert and oriented to person, place, and time.     Assessment/Plan Chest pain with some features worrisome for angina rule out MI Mild CAD in past Hypertension Hypercholesteremia GERD History of hiatus hernia Plan As per orders  Rakesh Dutko N 02/22/2013, 5:56 PM

## 2013-02-22 NOTE — ED Notes (Signed)
Pt reports she does take potassium pills at home but hasn't taken in the last week.

## 2013-02-22 NOTE — Progress Notes (Signed)
ANTICOAGULATION CONSULT NOTE - Initial Consult  Pharmacy Consult for Heparin Indication: chest pain/ACS  No Known Allergies  Patient Measurements: Height: 5\' 7"  (170.2 cm) Weight: 180 lb (81.647 kg) IBW/kg (Calculated) : 61.6  Vital Signs: Temp: 98 F (36.7 C) (01/28 1642) Temp src: Oral (01/28 1642) BP: 148/74 mmHg (01/28 1642) Pulse Rate: 67 (01/28 1642)  Labs:  Recent Labs  02/22/13 1253  HGB 14.9  HCT 42.2  PLT 249  CREATININE 0.92  TROPONINI <0.30    Estimated Creatinine Clearance: 66.1 ml/min (by C-G formula based on Cr of 0.92).   Medical History: Past Medical History  Diagnosis Date  . Hypertension   . Hyperlipidemia   . Esophageal reflux   . Onychomycosis   . Thyroid enlargement     Assessment: 68 year old female beginning heparin for CP  Goal of Therapy:  Heparin level 0.3-0.7 units/ml Monitor platelets by anticoagulation protocol: Yes   Plan:  1) Heparin 4000 units iv bolus x 1 2) Heparin drip at 1200 units / hr 3) Heparin level and CBC daily  Thank you. Okey RegalLisa Asmar Brozek, PharmD [phone]  02/22/2013,6:33 PM

## 2013-02-22 NOTE — ED Provider Notes (Signed)
CSN: [id number]     Arrival date & time 02/22/13  1237 History   First MD Initiated Contact with Patient 02/22/13 1244     Chief Complaint  Patient presents with  . Chest Pain   (Consider location/radiation/quality/duration/timing/severity/associated sxs/prior Treatment) Patient is a 68 y.o. female presenting with chest pain. The history is provided by the patient. No language interpreter was used.  Chest Pain Pain location:  R chest Pain quality: dull   Pain radiates to:  Does not radiate Pain radiates to the back: no   Pain severity:  Mild Duration:  1 day Timing:  Constant Progression:  Unchanged Chronicity:  New Context: at rest   Relieved by:  Nothing Worsened by:  Deep breathing Ineffective treatments:  None tried Associated symptoms: no abdominal pain, no back pain, no claudication, no cough, no diaphoresis, no fatigue, no fever, no headache, no lower extremity edema, no nausea, no numbness, no palpitations, no shortness of breath, no syncope, not vomiting and no weakness   Risk factors: high cholesterol and hypertension   Risk factors: no aortic disease, no birth control, no coronary artery disease, no diabetes mellitus, no immobilization, not female, no Marfan's syndrome, not obese, not pregnant, no prior DVT/PE, no smoking and no surgery     Past Medical History  Diagnosis Date  . Hypertension   . Hyperlipidemia   . Esophageal reflux   . Onychomycosis   . Thyroid enlargement    History reviewed. No pertinent past surgical history. No family history on file. History  Substance Use Topics  . Smoking status: Never Smoker   . Smokeless tobacco: Not on file  . Alcohol Use: No   OB History   Grav Para Term Preterm Abortions TAB SAB Ect Mult Living                 Review of Systems  Constitutional: Negative for fever, chills, diaphoresis, activity change, appetite change and fatigue.  HENT: Negative for congestion, facial swelling, rhinorrhea and sore throat.    Eyes: Negative for photophobia and discharge.  Respiratory: Negative for cough, chest tightness and shortness of breath.   Cardiovascular: Positive for chest pain. Negative for palpitations, claudication, leg swelling and syncope.  Gastrointestinal: Negative for nausea, vomiting, abdominal pain and diarrhea.  Endocrine: Negative for polydipsia and polyuria.  Genitourinary: Negative for dysuria, frequency, difficulty urinating and pelvic pain.  Musculoskeletal: Negative for arthralgias, back pain, neck pain and neck stiffness.  Skin: Negative for color change and wound.  Allergic/Immunologic: Negative for immunocompromised state.  Neurological: Negative for facial asymmetry, weakness, numbness and headaches.  Hematological: Does not bruise/bleed easily.  Psychiatric/Behavioral: Negative for confusion and agitation.    Allergies  Review of patient's allergies indicates no known allergies.  Home Medications   Current Outpatient Rx  Name  Route  Sig  Dispense  Refill  . amLODipine (NORVASC) 5 MG tablet   Oral   Take 5 mg by mouth daily.         Marland Kitchen. aspirin 81 MG tablet   Oral   Take 81 mg by mouth every other day.         . cholecalciferol (VITAMIN D) 400 UNITS TABS   Oral   Take 400 Units by mouth daily.         . Glucosamine-Chondroit-Vit C-Mn (GLUCOSAMINE-CHONDROITIN) CAPS   Oral   Take 1 capsule by mouth daily.         Marland Kitchen. levothyroxine (SYNTHROID, LEVOTHROID) 25 MCG tablet   Oral  Take 25 mcg by mouth daily.         . Multiple Vitamin (MULTIVITAMIN) tablet   Oral   Take 1 tablet by mouth daily.         . pantoprazole (PROTONIX) 40 MG tablet   Oral   Take 40 mg by mouth daily.         . potassium chloride SA (K-DUR,KLOR-CON) 20 MEQ tablet   Oral   Take 20 mEq by mouth daily.         Marland Kitchen triamterene-hydrochlorothiazide (MAXZIDE-25) 37.5-25 MG per tablet   Oral   Take 1 tablet by mouth daily.          BP 128/84  Pulse 54  Temp(Src) 97.6 F  (36.4 C)  Resp 10  SpO2 100% Physical Exam  Constitutional: She is oriented to person, place, and time. She appears well-developed and well-nourished. No distress.  HENT:  Head: Normocephalic and atraumatic.  Mouth/Throat: No oropharyngeal exudate.  Eyes: Pupils are equal, round, and reactive to light.  Neck: Normal range of motion. Neck supple.  Cardiovascular: Normal rate, regular rhythm and normal heart sounds.  Exam reveals no gallop and no friction rub.   No murmur heard. Pulmonary/Chest: Effort normal and breath sounds normal. No respiratory distress. She has no wheezes. She has no rales.  Abdominal: Soft. Bowel sounds are normal. She exhibits no distension and no mass. There is no tenderness. There is no rebound and no guarding.  Musculoskeletal: Normal range of motion. She exhibits no edema and no tenderness.  Neurological: She is alert and oriented to person, place, and time.  Skin: Skin is warm and dry.  Psychiatric: She has a normal mood and affect.    ED Course  Procedures (including critical care time) Labs Review Labs Reviewed  BASIC METABOLIC PANEL - Abnormal; Notable for the following:    Potassium 3.3 (*)    Glucose, Bld 110 (*)    GFR calc non Af Amer 63 (*)    GFR calc Af Amer 74 (*)    All other components within normal limits  CBC WITH DIFFERENTIAL  TROPONIN I  PRO B NATRIURETIC PEPTIDE  D-DIMER, QUANTITATIVE   Imaging Review Dg Chest 2 View  02/22/2013   CLINICAL DATA:  Chest pain and pressure.  EXAM: CHEST  2 VIEW  COMPARISON:  08/15/2005  FINDINGS: Two views the chest demonstrate clear lungs. Heart and mediastinum are within normal limits. Trachea is midline.  IMPRESSION: No active cardiopulmonary disease.   Electronically Signed   By: Richarda Overlie M.D.   On: 02/22/2013 13:55    EKG Interpretation    Date/Time:  Wednesday February 22 2013 12:40:50 EST Ventricular Rate:  83 PR Interval:  150 QRS Duration: 72 QT Interval:  362 QTC Calculation: 425 R  Axis:   12 Text Interpretation:  Normal sinus rhythm Nonspecific ST abnormality Artifact No significant change since last tracing Confirmed by DOCHERTY  MD, MEGAN ([id number]) on 02/22/2013 12:51:45 PM            MDM   1. Chest pain    Pt is a 68 y.o. female with Pmhx as above who presents with central chest pressure beginning last night, now with a dull R sided pleuritic CP.  No assoc n/v, d/a, SOB, cough, LE pain/edema, diaphoresis.  On PE, VSS, pt in NAD.  No chest wall pain, cardiopulm exam benign. No LE edema or ttp.  EKG w/o acute ischemic changes.   3:00 PM CP resolved after  3 SL NTG.  Trop, BNP not elevated. CXR nml. D-dimer nml.  Given risk factors of age, HTN, HLD, and resolution of pain w/ NTG, I feel pt requires ACS r/o.  Will speak to cardiology about admission.  3:32 PM Dr. Sharyn Lull will admit to tele.       Shanna Cisco, MD 02/22/13 1534

## 2013-02-22 NOTE — ED Notes (Signed)
Ordered pt heart healthy diet meal tray

## 2013-02-22 NOTE — ED Notes (Signed)
Pressure in chest since last night no n/v/sob now sore this am  More rt side than left, hurts to take a deep breath

## 2013-02-23 ENCOUNTER — Observation Stay (HOSPITAL_COMMUNITY): Payer: Medicare Other

## 2013-02-23 LAB — BASIC METABOLIC PANEL
BUN: 8 mg/dL (ref 6–23)
CO2: 27 meq/L (ref 19–32)
Calcium: 8.8 mg/dL (ref 8.4–10.5)
Chloride: 104 mEq/L (ref 96–112)
Creatinine, Ser: 0.82 mg/dL (ref 0.50–1.10)
GFR calc Af Amer: 85 mL/min — ABNORMAL LOW (ref >90)
GFR, EST NON AFRICAN AMERICAN: 73 mL/min — AB (ref >90)
GLUCOSE: 90 mg/dL (ref 70–99)
Potassium: 3.9 mEq/L (ref 3.7–5.3)
SODIUM: 142 meq/L (ref 137–147)

## 2013-02-23 LAB — LIPID PANEL
Cholesterol: 177 mg/dL (ref 0–200)
HDL: 63 mg/dL (ref >39)
LDL CALC: 103 mg/dL — AB (ref 0–99)
Total CHOL/HDL Ratio: 2.8 RATIO
Triglycerides: 54 mg/dL (ref <150)
VLDL: 11 mg/dL (ref 0–40)

## 2013-02-23 LAB — CBC
HCT: 37.3 % (ref 36.0–46.0)
Hemoglobin: 12.8 g/dL (ref 12.0–15.0)
MCH: 30 pg (ref 26.0–34.0)
MCHC: 34.3 g/dL (ref 30.0–36.0)
MCV: 87.4 fL (ref 78.0–100.0)
Platelets: 222 10*3/uL (ref 150–400)
RBC: 4.27 MIL/uL (ref 3.87–5.11)
RDW: 12.8 % (ref 11.5–15.5)
WBC: 6.5 10*3/uL (ref 4.0–10.5)

## 2013-02-23 LAB — TROPONIN I: Troponin I: <0.3 ng/mL (ref <0.30)

## 2013-02-23 LAB — TSH: TSH: 1.706 u[IU]/mL (ref 0.350–4.500)

## 2013-02-23 LAB — HEPARIN LEVEL (UNFRACTIONATED): HEPARIN UNFRACTIONATED: 1.36 [IU]/mL — AB (ref 0.30–0.70)

## 2013-02-23 MED ORDER — TECHNETIUM TC 99M SESTAMIBI GENERIC - CARDIOLITE
30.0000 | Freq: Once | INTRAVENOUS | Status: AC | PRN
  Administered 2013-02-23: 30 via INTRAVENOUS

## 2013-02-23 MED ORDER — REGADENOSON 0.4 MG/5ML IV SOLN
INTRAVENOUS | Status: AC
  Filled 2013-02-23: qty 5

## 2013-02-23 MED ORDER — REGADENOSON 0.4 MG/5ML IV SOLN
0.4000 mg | Freq: Once | INTRAVENOUS | Status: AC
  Administered 2013-02-23: 0.4 mg via INTRAVENOUS

## 2013-02-23 MED ORDER — TECHNETIUM TC 99M SESTAMIBI GENERIC - CARDIOLITE
10.0000 | Freq: Once | INTRAVENOUS | Status: AC | PRN
  Administered 2013-02-23: 10 via INTRAVENOUS

## 2013-02-23 MED ORDER — NITROGLYCERIN 0.4 MG SL SUBL: 0.4000 mg | SUBLINGUAL_TABLET | SUBLINGUAL | Status: DC | PRN

## 2013-02-23 NOTE — Progress Notes (Signed)
UR completed 

## 2013-02-23 NOTE — Discharge Summary (Signed)
  Discharge summary dictated on 02/23/2013 dictation number is [phone] [id number]

## 2013-02-23 NOTE — Discharge Instructions (Signed)
Angina Pectoris  Angina pectoris, often just called angina, is extreme discomfort in your chest, neck, or arm caused by a lack of blood in the middle and thickest layer of your heart wall (myocardium). It may feel like tightness or heavy pressure. It may feel like a crushing or squeezing pain. Some people say it feels like gas or indigestion. It may go down your shoulders, back, and arms. Some people may have symptoms other than pain. These symptoms include fatigue, shortness of breath, cold sweats, or nausea. There are four different types of angina:   Stable angina Stable angina usually occurs in episodes of predictable frequency and duration. It usually is brought on by physical activity, emotional stress, or excitement. These are all times when the myocardium needs more oxygen. Stable angina usually lasts a few minutes and often is relieved by taking a medicine that can be taken under your tongue (sublingually). The medicine is called nitroglycerin. Stable angina is caused by a buildup of plaque inside the arteries, which restricts blood flow to the heart muscle (atherosclerosis).   Unstable angina Unstable angina can occur even when your body experiences little or no physical exertion. It can occur during sleep. It can also occur at rest. It can suddenly increase in severity or frequency. It might not be relieved by sublingual nitroglycerin. It can last up to 30 minutes. The most common cause of unstable angina is a blood clot that has developed on the top of plaque buildup inside a coronary artery. It can lead to a heart attack if the blood clot completely blocks the artery.   Microvascular angina This type of angina is caused by a disorder of tiny blood vessels called arterioles. Microvascular angina is more common in women. The pain may be more severe and last longer than other types of angina pectoris.   Prinzmetal or variant angina This type of angina pectoris usually occurs when your body experiences  little or no physical exertion. It especially occurs in the early morning hours. It is caused by a spasm of your coronary artery.  HOME CARE INSTRUCTIONS    Only take over-the-counter and prescription medicines as directed by your caregiver.   Stay active or increase your exercise as directed by your caregiver.   Limit strenuous activity as directed by your caregiver.   Limit heavy lifting as directed by your caregiver.   Maintain a healthy weight.   Learn about and eat heart-healthy foods.   Do not smoke.  SEEK IMMEDIATE MEDICAL CARE IF:   You experience the following symptoms:   Chest, neck, deep shoulder, or arm pain or discomfort that lasts more than a few minutes.   Chest, neck, deep shoulder, or arm pain or discomfort that goes away and comes back, repeatedly.   Heavy sweating with discomfort, without a noticeable cause.   Shortness of breath or difficulty breathing.   Angina that does not get better after a few minutes of rest or after taking sublingual nitroglycerin.  These can all be symptoms of a heart attack, which is a medical emergency! Get medical help at once. Call your local emergency service (911 in [place] immediately. Do not  drive yourself to the hospital and do not  wait to for your symptoms to go away.  MAKE SURE YOU:   Understand these instructions.   Will watch your condition.   Will get help right away if you are not doing well or get worse.  Document Released: 01/12/2005 Document Revised: 12/30/2011 Document Reviewed:   10/22/2011  [employer] Patient Information 2014 [employer]

## 2013-02-24 NOTE — Discharge Summary (Signed)
NAMEJOCILYN, [name]                 ACCOUNT NO.:  [id number]  MEDICAL RECORD NO.:  [id number]  LOCATION:  3W25C                        FACILITY:  [facility]  PHYSICIAN:  [name], M.D. DATE OF BIRTH:  [birth year]  DATE OF ADMISSION:  02/22/2013 DATE OF DISCHARGE:  02/23/2013                              DISCHARGE SUMMARY   ADMITTING DIAGNOSES: 1. Chest pain with some features worrisome for angina, rule out     myocardial infarction. 2. Mild coronary artery disease in the past. 3. Hypertension. 4. Hypercholesteremia. 5. Gastroesophageal reflux disease. 6. History of hiatus hernia.  DISCHARGE DIAGNOSES: 1. Status post chest pain, myocardial infarction ruled out, negative     Lexiscan Myoview stress test. 2. Mild coronary artery disease in the past, status post left     catheterization approximately 11 years ago, which showed mild     coronary artery disease with good left ventricular systolic     function. 3. Hypertension. 4. Hypercholesteremia. 5. Gastroesophageal reflux disease. 6. History of hiatus hernia.  DISCHARGE HOME MEDICATIONS: 1. Aspirin 81 mg 1 tablet daily. 2. Amlodipine 5 mg 1 tablet daily. 3. Protonix 40 mg 1 tablet daily. 4. Nitrostat 0.4 mg sublingual as directed. 5. Triamterene and hydrochlorothiazide 37.5/25 mg 1 tablet daily. 6. K-Dur 20 mEq 1 tablet daily. 7. Multivitamin 1 tablet daily. 8. Levothyroxine 25 mcg 1 tablet daily. 9. Glucosamine and chondroitin sulfate 1 capsule daily. 10.Vitamin D 1 tablet daily.  DIET:  Low salt, low cholesterol.  ACTIVITY:  As tolerated.  FOLLOWUP:  Follow up with me in 1 week.  Follow with PMD as scheduled.  CONDITION AT DISCHARGE:  Stable.  BRIEF HISTORY AND HOSPITAL COURSE:  Ms. [name] is a 68 year old female with past medical history significant for mild coronary artery disease. She had cath approximately 11 years ago, which showed mild coronary artery disease with good LV systolic function,  hypertension, hypercholesteremia, controlled by diet, hypothyroidism, GERD, hiatus hernia.  She came to the ER complaining of right-sided chest pain described as discomfort, soreness, grade 7/10 without associated nausea, vomiting, or diaphoresis.  The patient received three sublingual nitroglycerins in the ER with relief of chest pain.  EKG done in the ER showed normal sinus rhythm with nonspecific ST-T wave changes.  The patient denies any relation of chest pain to food, but states occasionally chest pain increases with deep breathing.  Denies any history of cough, cold or flu recently, but states approximately 4 weeks ago, had flu.  Denies any relation of chest pain to food or heavy lifting.  Denies any relation of chest pain to any position.  PAST MEDICAL HISTORY:  As above.  PHYSICAL EXAMINATION:  GENERAL:  She was alert, awake, oriented x3. VITAL SIGNS:  Blood pressure was 148/74, pulse was 67, she was afebrile. HEENT:  Conjunctiva was pink. NECK:  Supple.  No JVD.  No bruit. LUNGS:  Clear to auscultation without rhonchi or rales. CARDIOVASCULAR:  S1, S2 was normal.  There was soft systolic murmur.  No S3 gallop. ABDOMEN:  Soft.  Bowel sounds were present.  Nontender. EXTREMITIES:  There was no clubbing, cyanosis, or edema. NEUROLOGIC:  Grossly intact.  LABORATORY DATA:  Sodium  was 139, potassium 3.3, repeat potassium was 3.8, BUN 8, creatinine 0.92.  Four sets of troponin-I were normal.  Pro- BNP was normal at 51.9.  Her hemoglobin was 14.9, hematocrit 42.2, white count of 5.1.  Cholesterol was 177, triglycerides 54, HDL was 63, LDL was slightly elevated at 103.  Her D-dimer was 0.27, which was in normal range.  Her TSH was 1.70.  Chest x-ray showed no active cardiopulmonary disease.  Her Lexiscan Myoview showed no evidence of inducible ischemia with EF of 63% with normal cardiac wall motions.  BRIEF HOSPITAL COURSE:  The patient was admitted to Telemetry Unit.  MI was  ruled out by serial enzymes and EKG.  The patient did not have any further episodes of chest pain.  The patient subsequently underwent Lexiscan Myoview, which showed no evidence of ischemia with normal LV systolic function.  The patient will be discharged home on above medications and will be followed up in my office in 1 week and her primary medical doctor as scheduled.     Monique OsierMohan N. Sharyn Diaz, M.D.     [facility]  D:  02/23/2013  T:  02/24/2013  Job:  [id number]

## 2013-05-02 ENCOUNTER — Other Ambulatory Visit: Payer: Self-pay

## 2013-05-02 DIAGNOSIS — Z1231 Encounter for screening mammogram for malignant neoplasm of breast: Secondary | ICD-10-CM

## 2013-06-06 ENCOUNTER — Encounter (INDEPENDENT_AMBULATORY_CARE_PROVIDER_SITE_OTHER): Payer: Self-pay

## 2013-06-06 ENCOUNTER — Ambulatory Visit
Admission: RE | Admit: 2013-06-06 | Discharge: 2013-06-06 | Disposition: A | Discharge status: Home / Self Care | Payer: 59 | Source: Ambulatory Visit

## 2013-06-06 DIAGNOSIS — Z1231 Encounter for screening mammogram for malignant neoplasm of breast: Secondary | ICD-10-CM

## 2013-10-10 ENCOUNTER — Ambulatory Visit (INDEPENDENT_AMBULATORY_CARE_PROVIDER_SITE_OTHER): Payer: Medicare Other

## 2013-10-10 ENCOUNTER — Encounter: Payer: Self-pay | Admitting: Podiatry

## 2013-10-10 ENCOUNTER — Ambulatory Visit (INDEPENDENT_AMBULATORY_CARE_PROVIDER_SITE_OTHER): Payer: Medicare Other | Admitting: Podiatry

## 2013-10-10 VITALS — BP 145/80 | HR 74 | Resp 12

## 2013-10-10 DIAGNOSIS — S92919A Unspecified fracture of unspecified toe(s), initial encounter for closed fracture: Secondary | ICD-10-CM

## 2013-10-10 DIAGNOSIS — S92911A Unspecified fracture of right toe(s), initial encounter for closed fracture: Secondary | ICD-10-CM

## 2013-10-10 DIAGNOSIS — S99921A Unspecified injury of right foot, initial encounter: Secondary | ICD-10-CM

## 2013-10-10 DIAGNOSIS — S99929A Unspecified injury of unspecified foot, initial encounter: Secondary | ICD-10-CM

## 2013-10-10 DIAGNOSIS — S8990XA Unspecified injury of unspecified lower leg, initial encounter: Secondary | ICD-10-CM

## 2013-10-10 DIAGNOSIS — S99919A Unspecified injury of unspecified ankle, initial encounter: Secondary | ICD-10-CM

## 2013-10-10 DIAGNOSIS — M79609 Pain in unspecified limb: Secondary | ICD-10-CM

## 2013-10-10 NOTE — Progress Notes (Signed)
   Subjective:    Patient ID: [name], female    DOB: [birth year], 68 y.o.   MRN: [id number]  HPI PT STATED INJURED HIT THE TOE BACK OF THE SHOE AND IS BEEN SORE FOR ABOUT 1 MONTH. THE TOE IS BEEN THE SAME NO GETTING WORSE. THE TOE GET AGGRAVATED BY PRESSURE OR WEARING CLOSED SHOES. TRIED NO TREATMENT.  Review of Systems  Respiratory: Positive for chest tightness.   All other systems reviewed and are negative.      Objective:   Physical Exam: I have reviewed her past medical history medications allergies surgeries social history and review of systems. Pulses are strongly palpable bilateral. Neurologic is intact since once the monofilament. Deep tendon reflexes are intact bilateral muscle strength is 5 over 5 dorsiflexors plantar flexors inverters everters all intrinsic musculature is intact. Orthopedic evaluation demonstrates all joints distal to the ankle a full range of motion without crepitation. He has erythema and edema with pain on palpation fifth PIPJ right foot. Radiographic evaluation does demonstrate what appears to be a fracture of the head of the proximal phalanx as well as a fracture to the intermediate phalanx of the fifth toe right foot. These are mildly displaced but non-comminuted.        Assessment & Plan:  Assessment: Fracture fifth digit right foot.  Plan: Demonstrated to her how wrap the fifth toe with Caban and was dispensed a Darco shoe which she will wear for one month.

## 2013-11-07 ENCOUNTER — Ambulatory Visit: Payer: 59 | Admitting: Podiatry

## 2014-05-07 ENCOUNTER — Other Ambulatory Visit: Payer: Self-pay

## 2014-05-07 DIAGNOSIS — Z1231 Encounter for screening mammogram for malignant neoplasm of breast: Secondary | ICD-10-CM

## 2014-06-13 ENCOUNTER — Ambulatory Visit
Admission: RE | Admit: 2014-06-13 | Discharge: 2014-06-13 | Disposition: A | Discharge status: Home / Self Care | Payer: Medicare Other | Source: Ambulatory Visit

## 2014-06-13 ENCOUNTER — Encounter (INDEPENDENT_AMBULATORY_CARE_PROVIDER_SITE_OTHER): Payer: Self-pay

## 2014-06-13 ENCOUNTER — Ambulatory Visit: Payer: Self-pay

## 2014-06-13 DIAGNOSIS — Z1231 Encounter for screening mammogram for malignant neoplasm of breast: Secondary | ICD-10-CM

## 2015-03-04 DIAGNOSIS — E785 Hyperlipidemia, unspecified: Secondary | ICD-10-CM | POA: Insufficient documentation

## 2015-03-04 DIAGNOSIS — I10 Essential (primary) hypertension: Secondary | ICD-10-CM | POA: Insufficient documentation

## 2015-03-04 DIAGNOSIS — K449 Diaphragmatic hernia without obstruction or gangrene: Secondary | ICD-10-CM | POA: Insufficient documentation

## 2015-03-04 DIAGNOSIS — Z6827 Body mass index (BMI) 27.0-27.9, adult: Secondary | ICD-10-CM | POA: Insufficient documentation

## 2015-03-04 DIAGNOSIS — M545 Low back pain, unspecified: Secondary | ICD-10-CM | POA: Insufficient documentation

## 2015-03-04 DIAGNOSIS — Z283 Underimmunization status: Secondary | ICD-10-CM | POA: Insufficient documentation

## 2015-03-04 DIAGNOSIS — B351 Tinea unguium: Secondary | ICD-10-CM | POA: Insufficient documentation

## 2015-03-04 DIAGNOSIS — Z2839 Other underimmunization status: Secondary | ICD-10-CM | POA: Insufficient documentation

## 2015-03-04 DIAGNOSIS — L989 Disorder of the skin and subcutaneous tissue, unspecified: Secondary | ICD-10-CM | POA: Insufficient documentation

## 2015-03-04 DIAGNOSIS — E559 Vitamin D deficiency, unspecified: Secondary | ICD-10-CM | POA: Insufficient documentation

## 2015-03-04 DIAGNOSIS — E049 Nontoxic goiter, unspecified: Secondary | ICD-10-CM | POA: Insufficient documentation

## 2015-03-06 ENCOUNTER — Encounter: Payer: Self-pay | Admitting: Interventional Cardiology

## 2015-03-06 ENCOUNTER — Ambulatory Visit (INDEPENDENT_AMBULATORY_CARE_PROVIDER_SITE_OTHER): Payer: Medicare Other | Admitting: Interventional Cardiology

## 2015-03-06 VITALS — BP 120/82 | HR 66 | Ht 67.0 in | Wt 171.4 lb

## 2015-03-06 DIAGNOSIS — I10 Essential (primary) hypertension: Secondary | ICD-10-CM | POA: Diagnosis not present

## 2015-03-06 DIAGNOSIS — E785 Hyperlipidemia, unspecified: Secondary | ICD-10-CM | POA: Diagnosis not present

## 2015-03-06 DIAGNOSIS — R0789 Other chest pain: Secondary | ICD-10-CM | POA: Diagnosis not present

## 2015-03-06 NOTE — Patient Instructions (Signed)
Medication Instructions:  Your physician recommends that you continue on your current medications as directed. Please refer to the Current Medication list given to you today.  Labwork: none  Testing/Procedures: Non-Cardiac CT scanning, (CAT scanning), is a noninvasive, special x-ray that produces cross-sectional images of the body using x-rays and a computer. CT scans help physicians diagnose and treat medical conditions. For some CT exams, a contrast material is used to enhance visibility in the area of the body being studied. CT scans provide greater clarity and reveal more details than regular x-ray exams.    Follow-Up: Pending results of testing.  Any Other Special Instructions Will Be Listed Below (If Applicable).     If you need a refill on your cardiac medications before your next appointment, please call your pharmacy.

## 2015-03-06 NOTE — Progress Notes (Signed)
Cardiology Office Note   Date:  03/06/2015   ID:  [name], DOB 01/04/1946, MRN [id number]  PCP:  Willey Blade, MD  Cardiologist:  Lesleigh Noe, MD   Chief Complaint  Patient presents with  . Chest Pain      History of Present Illness: [name] is a 70 y.o. female who presents for chest discomfort  The patient is a very pleasant 70 year old African-American female referred by Dr. Willey Blade. She has a long history of chest tightness. In 2005 cardiac evaluation led to coronary angiography by Dr. Sharyn Lull which was normal. Subsequent evaluation in 2014 with myocardial perfusion imaging with pharmacologic stress was also without evidence of ischemia and demonstrated normal LV function. The patient's personal risk factor profile for vascular disease includes hypertension and hyperlipidemia. There is no family history, diabetes, tobacco use history, or other significant components.  She describes discomfort as a tightness. It occurs spontaneously. It is not precipitated by activity nor associated with other complaints such as dyspnea, nausea, diaphoresis, palpitations, or radiation. This is the same discomfort that she has had over the years with a prior negative cardiac workup.   Past Medical History  Diagnosis Date  . Hypertension   . Hyperlipidemia   . Esophageal reflux   . Onychomycosis   . Thyroid enlargement   . BMI 27.0-27.9,adult   . Immunization deficiency   . Chest pain   . Low back pain   . Skin lesion   . Hernia, hiatal   . Vitamin D deficiency     Past Surgical History  Procedure Laterality Date  . Gallbladder surgery    . Bone spurs removal       Current Outpatient Prescriptions  Medication Sig Dispense Refill  . amLODipine (NORVASC) 10 MG tablet Take 10 mg by mouth daily.    Marland Kitchen aspirin 81 MG tablet Take 81 mg by mouth every other day.    . cholecalciferol (VITAMIN D) 400 UNITS TABS Take 400 Units by mouth daily.    Marland Kitchen levothyroxine (SYNTHROID,  LEVOTHROID) 25 MCG tablet Take 25 mcg by mouth daily.    . Multiple Vitamin (MULTIVITAMIN) tablet Take 1 tablet by mouth daily.    . pantoprazole (PROTONIX) 40 MG tablet Take 40 mg by mouth daily.    . potassium chloride SA (K-DUR,KLOR-CON) 20 MEQ tablet Take 20 mEq by mouth daily.    . tacrolimus (PROTOPIC) 0.03 % ointment Apply 1 application topically 2 (two) times daily as needed (chapped lips).     . triamterene-hydrochlorothiazide (MAXZIDE-25) 37.5-25 MG per tablet Take 1 tablet by mouth daily.     No current facility-administered medications for this visit.    Allergies:   Review of patient's allergies indicates no known allergies.    Social History:  The patient  reports that she has never smoked. She has never used smokeless tobacco. She reports that she does not drink alcohol.   Family History:  The patient's family history includes Cancer in her mother and sister; Cancer - Prostate in her brother; Healthy in her sister; Hypertension in her sister and sister; Parkinson's disease in her father.    ROS:  Please see the history of present illness.   Otherwise, review of systems are positive for anxiety and occasional cough.she is known to have a hiatal hernia, gastroesophageal reflux, chronic back discomfort, and a history of thyroiditis..   All other systems are reviewed and negative.    PHYSICAL EXAM: VS:  BP 120/82 mmHg  Pulse 66  Ht  (1.702 m)  Wt 171 lb 6.4 oz (77.747 kg)  BMI 26.84 kg/m2 , BMI Body mass index is 26.84 kg/(m^2). GEN: Well nourished, well developed, in no acute distress HEENT: normal Neck: no JVD, carotid bruits, or masses Cardiac: RRR.  There is no murmur, rub, or gallop. There is no edema. Respiratory:  clear to auscultation bilaterally, normal work of breathing. GI: soft, nontender, nondistended, + BS MS: no deformity or atrophy Skin: warm and dry, no rash Neuro:  Strength and sensation are intact Psych: euthymic mood, full affect   EKG:  EKG  is ordered today. The ekg reveals normal with the exception of nonspecific inferior T wave flattening.   Recent Labs: No results found for requested labs within last 365 days.    Lipid Panel    Component Value Date/Time   CHOL 177 02/23/2013 0504   TRIG 54 02/23/2013 0504   HDL 63 02/23/2013 0504   CHOLHDL 2.8 02/23/2013 0504   VLDL 11 02/23/2013 0504   LDLCALC 103* 02/23/2013 0504      Wt Readings from Last 3 Encounters:  03/06/15 171 lb 6.4 oz (77.747 kg)  02/23/13 179 lb 10 oz (81.478 kg)  02/02/12 180 lb (81.647 kg)      Other studies Reviewed: Additional studies/ records that were reviewed today include: records from Dr. Willey Blade and also the electronic health record from [facility].. The findings include identified and reviewed the prior myocardial perfusion study from one year ago..    ASSESSMENT AND PLAN:  1.  Chest discomfort  Prior normal coronary angina 2005 and negative myocardial perfusion study January 2015 My clinical impression is that the chest tightness/discomfort is noncardiac. We will perform a coronary calcium score to exclude additional risk that is not been previously identified. If a significant elevation in calcium score is noted, aggressive management of lipids will become necessary.   2. Hyperlipidemia  controlled   3. Hypertension, essential controlled   Current medicines are reviewed at length with the patient today.  The patient has the following concerns regarding medicines:  none.  The following changes/actions have been instituted:     CT coronary calcium score  Labs/ tests ordered today include:  No orders of the defined types were placed in this encounter.     Disposition:   FU with HS in PRN    Signed, Lesleigh Noe, MD  03/06/2015 11:28 AM    [facility] [address] [place], [place], [place]  [address] Phone: [phone]; Fax: [phone]

## 2015-03-18 ENCOUNTER — Encounter: Payer: Self-pay | Admitting: Interventional Cardiology

## 2015-03-25 ENCOUNTER — Encounter (HOSPITAL_COMMUNITY): Payer: Self-pay

## 2015-03-25 ENCOUNTER — Ambulatory Visit (HOSPITAL_COMMUNITY)
Admission: RE | Admit: 2015-03-25 | Discharge: 2015-03-25 | Disposition: A | Discharge status: Home / Self Care | Payer: Medicare Other | Source: Ambulatory Visit | Attending: Interventional Cardiology | Admitting: Interventional Cardiology

## 2015-03-25 DIAGNOSIS — K449 Diaphragmatic hernia without obstruction or gangrene: Secondary | ICD-10-CM | POA: Insufficient documentation

## 2015-03-25 DIAGNOSIS — R079 Chest pain, unspecified: Secondary | ICD-10-CM | POA: Insufficient documentation

## 2015-03-25 DIAGNOSIS — E785 Hyperlipidemia, unspecified: Secondary | ICD-10-CM | POA: Diagnosis not present

## 2015-03-25 DIAGNOSIS — I2510 Atherosclerotic heart disease of native coronary artery without angina pectoris: Secondary | ICD-10-CM | POA: Insufficient documentation

## 2015-03-25 DIAGNOSIS — I10 Essential (primary) hypertension: Secondary | ICD-10-CM | POA: Diagnosis not present

## 2015-03-25 LAB — POCT I-STAT CREATININE: CREATININE: 1 mg/dL (ref 0.44–1.00)

## 2015-03-25 MED ORDER — NITROGLYCERIN 0.4 MG SL SUBL
0.4000 mg | SUBLINGUAL_TABLET | SUBLINGUAL | Status: DC | PRN
  Administered 2015-03-25: 0.4 mg via SUBLINGUAL

## 2015-03-25 MED ORDER — IOHEXOL 350 MG/ML SOLN
100.0000 mL | Freq: Once | INTRAVENOUS | Status: AC | PRN
  Administered 2015-03-25: 100 mL via INTRAVENOUS

## 2015-03-25 MED ORDER — NITROGLYCERIN 0.4 MG SL SUBL
SUBLINGUAL_TABLET | SUBLINGUAL | Status: AC
  Filled 2015-03-25: qty 1

## 2015-03-26 ENCOUNTER — Telehealth: Payer: Self-pay

## 2015-03-26 DIAGNOSIS — I2584 Coronary atherosclerosis due to calcified coronary lesion: Principal | ICD-10-CM

## 2015-03-26 DIAGNOSIS — I2510 Atherosclerotic heart disease of native coronary artery without angina pectoris: Secondary | ICD-10-CM

## 2015-03-26 MED ORDER — ATORVASTATIN CALCIUM 10 MG PO TABS: 10.0000 mg | ORAL_TABLET | Freq: Every day | ORAL | Status: DC

## 2015-03-26 NOTE — Telephone Encounter (Signed)
-----   Message from Lyn Records, MD sent at 03/26/2015  8:09 AM EST ----- There is minimal to moderate calcium in arteries. This id not the source of symptoms but does confer moderate risk of future events. Plan atorvastatin 10 mg daily and clinical f/u. CT identified small Hiatal Hernia which is probable source of symptoms.

## 2015-03-26 NOTE — Telephone Encounter (Signed)
Pt aware of CT results and Dr.Smith's recommendations.  There is minimal to moderate calcium in arteries. This id not the source of symptoms but does confer moderate risk of future events. Plan atorvastatin 10 mg daily and clinical f/u. CT identified small Hiatal Hernia which is probable source of symptoms.  Pt agreeable. Rx sent pt pharmacy [facility]. Fasting lab appt scheduled for 05/14/15. Recall for f/u with Dr.Smith in Epic for 3-6 mo. Results of CT fwd to pcp to f/u on her hernia.

## 2015-05-13 ENCOUNTER — Other Ambulatory Visit: Payer: Self-pay

## 2015-05-13 DIAGNOSIS — Z1231 Encounter for screening mammogram for malignant neoplasm of breast: Secondary | ICD-10-CM

## 2015-05-14 ENCOUNTER — Other Ambulatory Visit: Payer: Medicare Other

## 2015-05-27 ENCOUNTER — Other Ambulatory Visit (INDEPENDENT_AMBULATORY_CARE_PROVIDER_SITE_OTHER): Payer: Medicare Other

## 2015-05-27 ENCOUNTER — Other Ambulatory Visit: Payer: Medicare Other

## 2015-05-27 DIAGNOSIS — I2584 Coronary atherosclerosis due to calcified coronary lesion: Secondary | ICD-10-CM

## 2015-05-27 DIAGNOSIS — I2510 Atherosclerotic heart disease of native coronary artery without angina pectoris: Secondary | ICD-10-CM

## 2015-05-27 LAB — LIPID PANEL
CHOLESTEROL: 171 mg/dL (ref 125–200)
HDL: 62 mg/dL (ref >=46)
LDL Cholesterol: 94 mg/dL (ref <130)
TRIGLYCERIDES: 76 mg/dL (ref <150)
Total CHOL/HDL Ratio: 2.8 Ratio (ref <=5.0)
VLDL: 15 mg/dL (ref <30)

## 2015-05-27 LAB — ALT: ALT: 10 U/L (ref 6–29)

## 2015-06-18 ENCOUNTER — Ambulatory Visit
Admission: RE | Admit: 2015-06-18 | Discharge: 2015-06-18 | Disposition: A | Discharge status: Home / Self Care | Payer: Medicare Other | Source: Ambulatory Visit

## 2015-06-18 DIAGNOSIS — Z1231 Encounter for screening mammogram for malignant neoplasm of breast: Secondary | ICD-10-CM

## 2015-07-15 ENCOUNTER — Telehealth: Payer: Self-pay | Admitting: Interventional Cardiology

## 2015-07-15 NOTE — Telephone Encounter (Signed)
This is copied from lipid profile results 05/27/15:  Notes Recorded by Lyn RecordsHenry W Smith, MD on 05/29/2015 at 7:16 PM Increase atorvastatin to 20 mg per day. Repeat lipid panel with liver panel in 3 months.. Attempting to get LDL cholesterol less than 70. Currently it is 94. 81 mg aspirin daily if not already being taken.   Pt states Dr Willey BladeEric Dean changed her from atorvastatin to rosuvastatin 5mg  two times/week 04/29/15, pt states she has follow up with Dr August Saucerean 07/22/15.  Pt confirmed that she is taking aspirin 81mg  daily.  Pt advised I will forward to Dr Katrinka BlazingSmith for review.

## 2015-07-15 NOTE — Telephone Encounter (Signed)
Pt states she received letter to call our office about lab results done in May 2017.

## 2015-07-15 NOTE — Telephone Encounter (Signed)
Follow Up   Pt returned call for results. Pleas call

## 2015-07-16 NOTE — Telephone Encounter (Signed)
Please send labs to Dr. August Saucerean, her PCP. I will defer management to Dr. August Saucerean.

## 2015-07-17 NOTE — Telephone Encounter (Signed)
Informed patient her labs will be sent to Dr. August Saucerean for management.  She would like to know from Dr. Katrinka BlazingSmith if she needs any further follow-up or if she may be seen as needed. She confirms she is asymptomatic.

## 2015-07-18 NOTE — Telephone Encounter (Signed)
See as needed 

## 2015-07-19 NOTE — Telephone Encounter (Signed)
Informed pt that Dr. Katrinka BlazingSmith said ok to f/u PRN. Pt verbalized understanding and was appreciative of call back.

## 2015-09-10 ENCOUNTER — Ambulatory Visit: Payer: Medicare Other | Admitting: Interventional Cardiology

## 2016-05-14 ENCOUNTER — Other Ambulatory Visit: Payer: Self-pay | Admitting: Internal Medicine

## 2016-05-14 DIAGNOSIS — Z1231 Encounter for screening mammogram for malignant neoplasm of breast: Secondary | ICD-10-CM

## 2016-06-23 ENCOUNTER — Ambulatory Visit
Admission: RE | Admit: 2016-06-23 | Discharge: 2016-06-23 | Disposition: A | Discharge status: Home / Self Care | Payer: Medicare Other | Source: Ambulatory Visit | Attending: Internal Medicine | Admitting: Internal Medicine

## 2016-06-23 DIAGNOSIS — Z1231 Encounter for screening mammogram for malignant neoplasm of breast: Secondary | ICD-10-CM

## 2017-05-11 ENCOUNTER — Encounter (HOSPITAL_COMMUNITY): Payer: Self-pay | Admitting: Emergency Medicine

## 2017-05-11 ENCOUNTER — Emergency Department (HOSPITAL_COMMUNITY)
Admission: EM | Admit: 2017-05-11 | Discharge: 2017-05-12 | Disposition: A | Discharge status: Home / Self Care | Payer: Medicare Other | Attending: Emergency Medicine | Admitting: Emergency Medicine

## 2017-05-11 ENCOUNTER — Other Ambulatory Visit: Payer: Self-pay

## 2017-05-11 DIAGNOSIS — I10 Essential (primary) hypertension: Secondary | ICD-10-CM | POA: Diagnosis not present

## 2017-05-11 DIAGNOSIS — W01198A Fall on same level from slipping, tripping and stumbling with subsequent striking against other object, initial encounter: Secondary | ICD-10-CM | POA: Insufficient documentation

## 2017-05-11 DIAGNOSIS — S0990XA Unspecified injury of head, initial encounter: Secondary | ICD-10-CM | POA: Insufficient documentation

## 2017-05-11 DIAGNOSIS — M545 Low back pain, unspecified: Secondary | ICD-10-CM

## 2017-05-11 DIAGNOSIS — Y9301 Activity, walking, marching and hiking: Secondary | ICD-10-CM | POA: Insufficient documentation

## 2017-05-11 DIAGNOSIS — Z7982 Long term (current) use of aspirin: Secondary | ICD-10-CM | POA: Insufficient documentation

## 2017-05-11 DIAGNOSIS — Y999 Unspecified external cause status: Secondary | ICD-10-CM | POA: Diagnosis not present

## 2017-05-11 DIAGNOSIS — Z79899 Other long term (current) drug therapy: Secondary | ICD-10-CM | POA: Diagnosis not present

## 2017-05-11 DIAGNOSIS — W19XXXA Unspecified fall, initial encounter: Secondary | ICD-10-CM

## 2017-05-11 DIAGNOSIS — Y92007 Garden or yard of unspecified non-institutional (private) residence as the place of occurrence of the external cause: Secondary | ICD-10-CM | POA: Diagnosis not present

## 2017-05-11 NOTE — ED Triage Notes (Signed)
Pt states that she slipped in the driveway and hit the back of her head. Denies dizziness, neck pain, or headache. Does not take blood thinners. No neurological deficits. States she came to "get checked out just to be sure"

## 2017-05-12 ENCOUNTER — Emergency Department (HOSPITAL_COMMUNITY): Payer: Medicare Other

## 2017-05-12 MED ORDER — ACETAMINOPHEN 325 MG PO TABS
325.0000 mg | ORAL_TABLET | Freq: Once | ORAL | Status: AC
  Administered 2017-05-12: 325 mg via ORAL
  Filled 2017-05-12: qty 1

## 2017-05-12 NOTE — ED Provider Notes (Signed)
[facility] Provider Note   CSN: [id number] Arrival date & time: 05/11/17  2131     History   Chief Complaint Chief Complaint  Patient presents with  . Fall  . Headache    HPI [name] is a 72 y.o. female.  HPI  This is a [age] year old female with a history of hypertension, hyperlipidemia, thyroid disease who presents after a fall.  Patient reports that she slipped and fell backwards in her yard.  She hit her head on the edge of a stone in garden.  She did not lose consciousness.  She states that she laid on the ground for several minutes "dazed" but was able to get up on her own.  This occurred at approximately 4:30 PM.  She is not on any anticoagulants.  She has been ambulatory.  She also reports some low back pain.  Her pain at 5 out of 10.  Worse with ambulation.  She has not taken anything for her pain.  No numbness or tingling in the legs.  Denies any bowel or bladder difficulties.  She states "I just wanted to get checked out."  She denies any vomiting.  Past Medical History:  Diagnosis Date  . BMI 27.0-27.9,adult   . Chest pain   . Esophageal reflux   . Hernia, hiatal   . Hyperlipidemia   . Hypertension   . Immunization deficiency   . Low back pain   . Onychomycosis   . Skin lesion   . Thyroid enlargement   . Vitamin D deficiency     Patient Active Problem List   Diagnosis Date Noted  . Hypertension   . Hyperlipidemia   . Onychomycosis   . Thyroid enlargement   . BMI 27.0-27.9,adult   . Immunization deficiency   . Low back pain   . Skin lesion   . Hernia, hiatal   . Vitamin D deficiency   . Chest pain 02/22/2013    Past Surgical History:  Procedure Laterality Date  . BONE SPURS REMOVAL    . GALLBLADDER SURGERY       OB History   None      Home Medications    Prior to Admission medications   Medication Sig Start Date End Date Taking? Authorizing Provider  amLODipine (NORVASC) 10 MG tablet Take 10  mg by mouth daily.    [provider]  aspirin 81 MG tablet Take 81 mg by mouth every other day.    [provider]  cholecalciferol (VITAMIN D) 400 UNITS TABS Take 400 Units by mouth daily.    [provider]  levothyroxine (SYNTHROID, LEVOTHROID) 25 MCG tablet Take 25 mcg by mouth daily.    [provider]  Multiple Vitamin (MULTIVITAMIN) tablet Take 1 tablet by mouth daily.    [provider]  pantoprazole (PROTONIX) 40 MG tablet Take 40 mg by mouth daily.    [provider]  potassium chloride SA (K-DUR,KLOR-CON) 20 MEQ tablet Take 20 mEq by mouth daily.    [provider]  rosuvastatin (CRESTOR) 5 MG tablet 1 tablet by mouth 2 times/ week--Dr Willey BladeEric Dean 04/29/15 07/15/15   Lyn RecordsSmith, Henry W, MD  tacrolimus (PROTOPIC) 0.03 % ointment Apply 1 application topically 2 (two) times daily as needed (chapped lips).  10/09/13   [provider]  triamterene-hydrochlorothiazide (MAXZIDE-25) 37.5-25 MG per tablet Take 1 tablet by mouth daily.    [provider]  atorvastatin (LIPITOR) 10 MG tablet Take 1 tablet (  10 mg total) by mouth daily. 03/26/15 07/15/15  Lyn Records, MD    Family History Family History  Problem Relation Age of Onset  . Cancer Mother        BREAST 1964  . Breast cancer Mother 77  . Parkinson's disease Father   . Cancer - Colon Unknown        1981  . Cancer Unknown        LIVER 2000  . Cancer Sister        UTERINE  . Hypertension Sister        AGE 6  . Cancer - Prostate Brother        AGE 59  . Hypertension Sister        AGE 59  . Healthy Sister        AGE 59    Social History Social History   Tobacco Use  . Smoking status: Never Smoker  . Smokeless tobacco: Never Used  Substance Use Topics  . Alcohol use: No    Alcohol/week: 0.0 oz  . Drug use: Not on file     Allergies   Patient has no known allergies.   Review of Systems Review of Systems  Constitutional: Negative for  fever.  Respiratory: Negative for shortness of breath.   Cardiovascular: Negative for chest pain.  Gastrointestinal: Negative for abdominal pain, nausea and vomiting.  Genitourinary: Negative for difficulty urinating.  Musculoskeletal: Positive for back pain.  Neurological: Negative for dizziness, syncope, weakness and headaches.  All other systems reviewed and are negative.    Physical Exam Updated Vital Signs BP (!) 157/81 (BP Location: Right Arm)   Pulse 70   Temp 98.4 F (36.9 C) (Oral)   Resp 18   Ht 5\' 7"  (1.702 m)   Wt 76.7 kg (169 lb)   SpO2 100%   BMI 26.47 kg/m   Physical Exam  Constitutional: She is oriented to person, place, and time.  Appears younger than stated age, ABCs intact  HENT:  Head: Normocephalic and atraumatic.  Eyes: Pupils are equal, round, and reactive to light.  Neck: Normal range of motion. Neck supple.  No midline C-spine tenderness to palpation, step-off, deformity  Cardiovascular: Normal rate, regular rhythm and normal heart sounds.  Pulmonary/Chest: Effort normal. No respiratory distress. She has no wheezes.  Abdominal: Soft. Bowel sounds are normal.  Musculoskeletal:  Normal range of motion of bilateral hips and knees, tenderness palpation lower lumbar spine without step-off or deformity  Neurological: She is alert and oriented to person, place, and time.  Cranial nerves II through XII intact, 5 out of 5 strength in all 4 extremities, normal reflexes bilateral lower extremities  Skin: Skin is warm and dry.  Psychiatric: She has a normal mood and affect.  Nursing note and vitals reviewed.    ED Treatments / Results  Labs (all labs ordered are listed, but only abnormal results are displayed) Labs Reviewed - No data to display  EKG None  Radiology Dg Lumbar Spine Complete  Result Date: 05/12/2017 CLINICAL DATA:  Fall EXAM: LUMBAR SPINE - COMPLETE 4+ VIEW COMPARISON:  None. FINDINGS: Surgical clips in the right upper quadrant.  Grade 1 anterolisthesis of L4 on L5, likely on the basis of facet degenerative change. Vertebral body heights are normal. Mild degenerative changes at L3-L4, L4-L5 and L5-S1. IMPRESSION: Grade 1 anterolisthesis of L4 on L5, probably due to facet degenerative change. No definite acute osseous abnormality. Electronically Signed   By: Adrian Prows.D.  On: 05/12/2017 00:57   Ct Head Wo Contrast  Result Date: 05/12/2017 CLINICAL DATA:  Hit back of head EXAM: CT HEAD WITHOUT CONTRAST TECHNIQUE: Contiguous axial images were obtained from the base of the skull through the vertex without intravenous contrast. COMPARISON:  None. FINDINGS: Brain: No acute territorial infarction, hemorrhage or intracranial mass. Mild atrophy and small vessel ischemic changes of the white matter. Normal ventricle size Vascular: No hyperdense vessels.  Carotid vascular calcification Skull: Normal. Negative for fracture or focal lesion. Sinuses/Orbits: No acute finding. Other: None IMPRESSION: 1. No CT evidence for acute intracranial abnormality. 2. Atrophy and mild small vessel ischemic changes of the white matter Electronically Signed   By: Jasmine Pang M.D.   On: 05/12/2017 00:56    Procedures Procedures (including critical care time)  Medications Ordered in ED Medications  acetaminophen (TYLENOL) tablet 325 mg (325 mg Oral Given 05/12/17 0115)     Initial Impression / Assessment and Plan / ED Course  I have reviewed the triage vital signs and the nursing notes.  Pertinent labs & imaging results that were available during my care of the patient were reviewed by me and considered in my medical decision making (see chart for details).     Presents after a fall from standing.  She denies syncope.  She is awake, alert, oriented.  Vital signs are reassuring.  Given her age, she is high risk per [place] CT head rules.  However, she does not have any obvious head trauma and she is approximately 6 hours post fall.  Will  obtain CT to rule out occult injury.  Furthermore, she has some midline lower back tenderness.  She is neurovascular intact.  No signs or symptoms of cauda equina.  X-rays are negative and CT scan is reassuring.  Patient was given Tylenol for discomfort.  Recommend follow-up with primary physician.  After history, exam, and medical workup I feel the patient has been appropriately medically screened and is safe for discharge home. Pertinent diagnoses were discussed with the patient. Patient was given return precautions.   Final Clinical Impressions(s) / ED Diagnoses   Final diagnoses:  Fall, initial encounter  Acute midline low back pain without sciatica  Minor head injury, initial encounter    ED Discharge Orders    None       Horton, Mayer Masker, MD 05/12/17 0120

## 2017-05-12 NOTE — Discharge Instructions (Addendum)
He was seen today after a fall.  Your CT scan and x-rays are negative.  Take Tylenol as needed for pain.  You may be sore tomorrow.  Follow-up with your primary physician.

## 2017-05-18 ENCOUNTER — Other Ambulatory Visit: Payer: Self-pay | Admitting: Internal Medicine

## 2017-05-18 DIAGNOSIS — Z1231 Encounter for screening mammogram for malignant neoplasm of breast: Secondary | ICD-10-CM

## 2017-06-24 ENCOUNTER — Ambulatory Visit
Admission: RE | Admit: 2017-06-24 | Discharge: 2017-06-24 | Disposition: A | Discharge status: Home / Self Care | Payer: Medicare Other | Source: Ambulatory Visit | Attending: Internal Medicine | Admitting: Internal Medicine

## 2017-06-24 DIAGNOSIS — Z1231 Encounter for screening mammogram for malignant neoplasm of breast: Secondary | ICD-10-CM

## 2018-01-08 ENCOUNTER — Ambulatory Visit (HOSPITAL_COMMUNITY)
Admission: EM | Admit: 2018-01-08 | Discharge: 2018-01-08 | Disposition: A | Discharge status: Home / Self Care | Payer: Medicare Other | Attending: Emergency Medicine | Admitting: Emergency Medicine

## 2018-01-08 ENCOUNTER — Encounter (HOSPITAL_COMMUNITY): Payer: Self-pay | Admitting: Emergency Medicine

## 2018-01-08 ENCOUNTER — Other Ambulatory Visit: Payer: Self-pay

## 2018-01-08 DIAGNOSIS — R110 Nausea: Secondary | ICD-10-CM | POA: Diagnosis not present

## 2018-01-08 DIAGNOSIS — R0602 Shortness of breath: Secondary | ICD-10-CM

## 2018-01-08 DIAGNOSIS — R42 Dizziness and giddiness: Secondary | ICD-10-CM | POA: Diagnosis not present

## 2018-01-08 LAB — POCT I-STAT, CHEM 8
BUN: 12 mg/dL (ref 8–23)
CREATININE: 0.8 mg/dL (ref 0.44–1.00)
Calcium, Ion: 1.12 mmol/L — ABNORMAL LOW (ref 1.15–1.40)
Chloride: 100 mmol/L (ref 98–111)
GLUCOSE: 132 mg/dL — AB (ref 70–99)
HCT: 43 % (ref 36.0–46.0)
Hemoglobin: 14.6 g/dL (ref 12.0–15.0)
Potassium: 3.4 mmol/L — ABNORMAL LOW (ref 3.5–5.1)
Sodium: 138 mmol/L (ref 135–145)
TCO2: 28 mmol/L (ref 22–32)

## 2018-01-08 MED ORDER — MECLIZINE HCL 12.5 MG PO TABS: 12.5000 mg | ORAL_TABLET | Freq: Three times a day (TID) | ORAL | 0 refills | Status: DC | PRN

## 2018-01-08 NOTE — Discharge Instructions (Signed)
Your dizziness and symptoms seem related to vertigo Please use meclizine as needed every 8 hours for nausea and dizziness This medicine will cause drowsiness, please do not drive after using.  Please limit at home.  This may increase your risk of falling, please be careful when moving around Please read attached instructions for the Epley maneuver to perform multiple times at home to help with dizziness  Please go to emergency room if developing worsening dizziness, chest pain, worsening shortness of breath, lightheadedness, difficulty breathing, fevers

## 2018-01-08 NOTE — ED Notes (Signed)
Bed: UC05 Expected date:  Expected time:  Means of arrival:  Comments: Hold for triage  

## 2018-01-08 NOTE — ED Triage Notes (Signed)
[facility] a [facility] SOB, nausea and weakness x 1 day.

## 2018-01-09 NOTE — ED Provider Notes (Signed)
[facility]    CSN: [id number] Arrival date & time: 01/08/18  1517     History   Chief Complaint Chief Complaint  Patient presents with  . Shortness of Breath    HPI [name] is a 72 y.o. female history of hypertension, hyperlipidemia presenting today for evaluation of dizziness.  Patient states that this morning when she woke up she felt dizzy.  Describes this as room spinning.  She had associated nausea, shortness of breath as well as weakness.  The dizziness has been off and on since she woke up this morning.  She checked her blood pressure at home and recorded it to be 158/87.  She denies associated chest pain.  She denies associated cough or fevers.  Denies lightheadedness.  Patient has been eating and drinking like normal.  She denies vision changes.  Denies history of vertigo.  Denies history of tobacco use.  Denies previous DVT/PE.  Denies recent travel, immobilization.  Denies leg pain or leg swelling.  HPI  Past Medical History:  Diagnosis Date  . BMI 27.0-27.9,adult   . Chest pain   . Esophageal reflux   . Hernia, hiatal   . Hyperlipidemia   . Hypertension   . Immunization deficiency   . Low back pain   . Onychomycosis   . Skin lesion   . Thyroid enlargement   . Vitamin D deficiency     Patient Active Problem List   Diagnosis Date Noted  . Hypertension   . Hyperlipidemia   . Onychomycosis   . Thyroid enlargement   . BMI 27.0-27.9,adult   . Immunization deficiency   . Low back pain   . Skin lesion   . Hernia, hiatal   . Vitamin D deficiency   . Chest pain 02/22/2013    Past Surgical History:  Procedure Laterality Date  . BONE SPURS REMOVAL    . GALLBLADDER SURGERY      OB History   No obstetric history on file.      Home Medications    Prior to Admission medications   Medication Sig Start Date End Date Taking? Authorizing Provider  amLODipine (NORVASC) 10 MG tablet Take 10 mg by mouth daily.    [provider]    aspirin 81 MG tablet Take 81 mg by mouth every other day.    [provider]  cholecalciferol (VITAMIN D) 400 UNITS TABS Take 400 Units by mouth daily.    [provider]  levothyroxine (SYNTHROID, LEVOTHROID) 25 MCG tablet Take 25 mcg by mouth daily.    [provider]  meclizine (ANTIVERT) 12.5 MG tablet Take 1 tablet (12.5 mg total) by mouth 3 (three) times daily as needed for dizziness. 01/08/18   Deldrick Linch C, PA-C  Multiple Vitamin (MULTIVITAMIN) tablet Take 1 tablet by mouth daily.    [provider]  pantoprazole (PROTONIX) 40 MG tablet Take 40 mg by mouth daily.    [provider]  potassium chloride SA (K-DUR,KLOR-CON) 20 MEQ tablet Take 20 mEq by mouth daily.    [provider]  rosuvastatin (CRESTOR) 5 MG tablet 1 tablet by mouth 2 times/ week--Dr Willey BladeEric Dean 04/29/15 07/15/15   Lyn RecordsSmith, Henry W, MD  tacrolimus (PROTOPIC) 0.03 % ointment Apply 1 application topically 2 (two) times daily as needed (chapped lips).  10/09/13   [provider]  triamterene-hydrochlorothiazide (MAXZIDE-25) 37.5-25 MG per tablet Take 1 tablet by mouth daily.    [provider]  atorvastatin (LIPITOR) 10 MG  tablet Take 1 tablet (10 mg total) by mouth daily. 03/26/15 07/15/15  Lyn Records, MD    Family History Family History  Problem Relation Age of Onset  . Cancer Mother        BREAST 1964  . Breast cancer Mother 20  . Parkinson's disease Father   . Cancer - Colon Unknown        1981  . Cancer Unknown        LIVER 2000  . Cancer Sister        UTERINE  . Hypertension Sister        AGE 49  . Cancer - Prostate Brother        AGE 73  . Hypertension Sister        AGE 28  . Healthy Sister        AGE 42    Social History Social History   Tobacco Use  . Smoking status: Never Smoker  . Smokeless tobacco: Never Used  Substance Use Topics  . Alcohol use: No    Alcohol/week: 0.0 standard drinks  . Drug use: Not on file      Allergies   Patient has no known allergies.   Review of Systems Review of Systems  Constitutional: Negative for fatigue and fever.  HENT: Negative for congestion, sinus pressure and sore throat.   Eyes: Negative for photophobia, pain and visual disturbance.  Respiratory: Positive for shortness of breath. Negative for cough.   Cardiovascular: Negative for chest pain.  Gastrointestinal: Positive for nausea. Negative for abdominal pain and vomiting.  Genitourinary: Negative for decreased urine volume and hematuria.  Musculoskeletal: Negative for myalgias, neck pain and neck stiffness.  Neurological: Positive for dizziness, weakness and headaches. Negative for syncope, facial asymmetry, speech difficulty, light-headedness and numbness.     Physical Exam Triage Vital Signs ED Triage Vitals  Enc Vitals Group     BP 01/08/18 1536 136/68     Pulse Rate 01/08/18 1536 (!) 57     Resp 01/08/18 1536 16     Temp 01/08/18 1625 (!) 97.5 F (36.4 C)     Temp Source 01/08/18 1536 Oral     SpO2 01/08/18 1536 100 %     Weight --      Height --      Head Circumference --      Peak Flow --      Pain Score 01/08/18 1535 0     Pain Loc --      Pain Edu? --      Excl. in GC? --    No data found.  Updated Vital Signs BP 136/68 (BP Location: Right Arm)   Pulse (!) 57   Temp (!) 97.5 F (36.4 C) (Oral)   Resp 16   SpO2 100%   Visual Acuity Right Eye Distance:   Left Eye Distance:   Bilateral Distance:    Right Eye Near:   Left Eye Near:    Bilateral Near:     Physical Exam Vitals signs and nursing note reviewed.  Constitutional:      General: She is not in acute distress.    Appearance: She is well-developed.  HENT:     Head: Normocephalic and atraumatic.     Ears:     Comments: Bilateral ears without tenderness to palpation of external auricle, tragus and mastoid, EAC's without erythema or swelling, TM's with good bony landmarks and cone of light. Non erythematous.     Mouth/Throat:  Comments: Oral mucosa pink and moist, no tonsillar enlargement or exudate. Posterior pharynx patent and nonerythematous, no uvula deviation or swelling. Normal phonation. Eyes:     Extraocular Movements: Extraocular movements intact.     Conjunctiva/sclera: Conjunctivae normal.     Pupils: Pupils are equal, round, and reactive to light.     Comments: No photophobia with exam  Neck:     Musculoskeletal: Neck supple.  Cardiovascular:     Rate and Rhythm: Normal rate and regular rhythm.     Heart sounds: No murmur.  Pulmonary:     Effort: Pulmonary effort is normal. No respiratory distress.     Breath sounds: Normal breath sounds.     Comments: Breathing comfortably at rest, CTABL, no wheezing, rales or other adventitious sounds auscultated Abdominal:     Palpations: Abdomen is soft.     Tenderness: There is no abdominal tenderness.  Skin:    General: Skin is warm and dry.  Neurological:     Mental Status: She is alert.     Comments: Patient A&O x3, cranial nerves II-XII grossly intact, strength at shoulders, hips and knees 5/5, equal bilaterally, patellar reflex 2+ bilaterally. Gait without abnormality. Negative Dix-Hallpike      UC Treatments / Results  Labs (all labs ordered are listed, but only abnormal results are displayed) Labs Reviewed  POCT I-STAT, CHEM 8 - Abnormal; Notable for the following components:      Result Value   Potassium 3.4 (*)    Glucose, Bld 132 (*)    Calcium, Ion 1.12 (*)    All other components within normal limits  I-STAT CHEM 8, ED    EKG None  Radiology No results found.  Procedures Procedures (including critical care time)  Medications Ordered in UC Medications - No data to display  Initial Impression / Assessment and Plan / UC Course  I have reviewed the triage vital signs and the nursing notes.  Pertinent labs & imaging results that were available during my care of the patient were reviewed by me and considered  in my medical decision making (see chart for details).     EKG sinus bradycardia, no acute signs of ischemia infarction or rhythm, no abnormal beats.  I-STAT relatively unremarkable, glucose slightly elevated, calcium and potassium right below normal limits.  Unlikely to be cause of symptoms.  Symptoms were not reproduced with Dix-Hallpike, but description of symptoms seem very vertiginous.  No neuro deficits.  Strength intact.  Lungs clear, vital signs stable.  Will treat for peripheral vertigo with meclizine and Epley maneuver.  Discussed other generalized recommendations of oral hydration, no sudden movements.  Continue to monitor symptoms and advised to go to emergency room if developing worsening symptoms, weakness, difficulty speaking, headache, changes in vision. Discussed strict return precautions. Patient verbalized understanding and is agreeable with plan.  Final Clinical Impressions(s) / UC Diagnoses   Final diagnoses:  Dizziness     Discharge Instructions     Your dizziness and symptoms seem related to vertigo Please use meclizine as needed every 8 hours for nausea and dizziness This medicine will cause drowsiness, please do not drive after using.  Please limit at home.  This may increase your risk of falling, please be careful when moving around Please read attached instructions for the Epley maneuver to perform multiple times at home to help with dizziness  Please go to emergency room if developing worsening dizziness, chest pain, worsening shortness of breath, lightheadedness, difficulty breathing, fevers   ED  Prescriptions    Medication Sig Dispense Auth. Provider   meclizine (ANTIVERT) 12.5 MG tablet Take 1 tablet (12.5 mg total) by mouth 3 (three) times daily as needed for dizziness. 30 tablet Saeed Toren, [place] C, PA-C     Controlled Substance Prescriptions [place] Controlled Substance Registry consulted? Not Applicable   Lew Dawes, [place] 01/09/18 [phone]

## 2018-03-29 ENCOUNTER — Ambulatory Visit
Admission: RE | Admit: 2018-03-29 | Discharge: 2018-03-29 | Disposition: A | Discharge status: Home / Self Care | Payer: Medicare Other | Source: Ambulatory Visit | Attending: Internal Medicine | Admitting: Internal Medicine

## 2018-03-29 ENCOUNTER — Other Ambulatory Visit: Payer: Self-pay | Admitting: Internal Medicine

## 2018-03-29 DIAGNOSIS — M25511 Pain in right shoulder: Secondary | ICD-10-CM

## 2018-05-04 ENCOUNTER — Ambulatory Visit
Admission: RE | Admit: 2018-05-04 | Discharge: 2018-05-04 | Disposition: A | Discharge status: Home / Self Care | Payer: Medicare Other | Source: Ambulatory Visit | Attending: Internal Medicine | Admitting: Internal Medicine

## 2018-05-04 ENCOUNTER — Other Ambulatory Visit: Payer: Self-pay | Admitting: Internal Medicine

## 2018-05-04 DIAGNOSIS — R079 Chest pain, unspecified: Secondary | ICD-10-CM

## 2018-05-04 DIAGNOSIS — R351 Nocturia: Secondary | ICD-10-CM

## 2018-05-04 DIAGNOSIS — R0600 Dyspnea, unspecified: Secondary | ICD-10-CM

## 2018-05-23 ENCOUNTER — Other Ambulatory Visit: Payer: Self-pay | Admitting: Internal Medicine

## 2018-05-23 DIAGNOSIS — Z1231 Encounter for screening mammogram for malignant neoplasm of breast: Secondary | ICD-10-CM

## 2018-06-15 ENCOUNTER — Encounter: Payer: Self-pay | Admitting: Cardiology

## 2018-06-15 ENCOUNTER — Ambulatory Visit: Payer: Medicare Other | Admitting: Cardiology

## 2018-06-15 ENCOUNTER — Other Ambulatory Visit: Payer: Self-pay

## 2018-06-15 VITALS — BP 147/79 | HR 64 | Ht 67.0 in | Wt 164.0 lb

## 2018-06-15 DIAGNOSIS — E785 Hyperlipidemia, unspecified: Secondary | ICD-10-CM

## 2018-06-15 DIAGNOSIS — R0789 Other chest pain: Secondary | ICD-10-CM

## 2018-06-15 DIAGNOSIS — I10 Essential (primary) hypertension: Secondary | ICD-10-CM

## 2018-06-15 DIAGNOSIS — K219 Gastro-esophageal reflux disease without esophagitis: Secondary | ICD-10-CM

## 2018-06-15 NOTE — Progress Notes (Signed)
Primary Physician/Referring:  Rogers Blocker, MD  Patient ID: [name], female    DOB: 07-31-1945, 73 y.o.   MRN: [id number]  Chief Complaint  Patient presents with  . Chest Pain  . Hypertension  . New Patient (Initial Visit)    HPI: [name]  is a 73 y.o. female  with Hypertension, mild hyperlipidemia, who is been referred to me on an urgent basis for evaluation of chest discomfort.  Chest pain started about 2 months ago.  Her husband recently passed away.  She's been under extreme stress.  States that the chest pain is in the middle of the chest and does not radiate and lasts for 10-15 minutes sometimes up to 20 minutes.  It is unrelated to deep breaths or moves a certain way, notices that at night and sometimes when she takes the dog for a walk.  She has not been exercising regularly but does notice improvement after she rests or sits up for a while.  No radiation of chest pain.  No other associated symptoms of dyspnea, palpitations, dizziness or syncope.  No recent weight changes.  Past Medical History:  Diagnosis Date  . BMI 27.0-27.9,adult   . Chest pain   . Esophageal reflux   . Hernia, hiatal   . Hyperlipidemia   . Hypertension   . Immunization deficiency   . Low back pain   . Onychomycosis   . Skin lesion   . Thyroid enlargement   . Vitamin D deficiency     Past Surgical History:  Procedure Laterality Date  . BONE SPURS REMOVAL    . GALLBLADDER SURGERY      Social History   Socioeconomic History  . Marital status: Widowed    Spouse name: Not on file  . Number of children: 0  . Years of education: Not on file  . Highest education level: Not on file  Occupational History  . Not on file  Social Needs  . Financial resource strain: Not on file  . Food insecurity:    Worry: Not on file    Inability: Not on file  . Transportation needs:    Medical: Not on file    Non-medical: Not on file  Tobacco Use  . Smoking status: Never Smoker  . Smokeless  tobacco: Never Used  Substance and Sexual Activity  . Alcohol use: No    Alcohol/week: 0.0 standard drinks  . Drug use: Not on file  . Sexual activity: Not on file  Lifestyle  . Physical activity:    Days per week: Not on file    Minutes per session: Not on file  . Stress: Not on file  Relationships  . Social connections:    Talks on phone: Not on file    Gets together: Not on file    Attends religious service: Not on file    Active member of club or organization: Not on file    Attends meetings of clubs or organizations: Not on file    Relationship status: Not on file  . Intimate partner violence:    Fear of current or ex partner: Not on file    Emotionally abused: Not on file    Physically abused: Not on file    Forced sexual activity: Not on file  Other Topics Concern  . Not on file  Social History Narrative  . Not on file    Review of Systems  Constitution: Negative for chills, decreased appetite, malaise/fatigue and weight gain.  Cardiovascular: Positive for chest pain. Negative for dyspnea on exertion, leg swelling and syncope.  Endocrine: Negative for cold intolerance.  Hematologic/Lymphatic: Does not bruise/bleed easily.  Musculoskeletal: Positive for muscle cramps. Negative for joint swelling.  Gastrointestinal: Positive for heartburn. Negative for abdominal pain, anorexia, change in bowel habit, hematochezia and melena.  Neurological: Negative for headaches and light-headedness.  Psychiatric/Behavioral: Negative for depression and substance abuse.  All other systems reviewed and are negative.     Objective  Blood pressure (!) 147/79, pulse 64, height 5' 7"  (1.702 m), weight 164 lb (74.4 kg), SpO2 96 %. Body mass index is 25.69 kg/m.    Physical Exam  Constitutional: She appears well-developed and well-nourished. No distress.  HENT:  Head: Atraumatic.  Eyes: Conjunctivae are normal.  Neck: Neck supple. No JVD present. No thyromegaly present.   Cardiovascular: Normal rate, regular rhythm, normal heart sounds and intact distal pulses. Exam reveals no gallop.  No murmur heard. Pulmonary/Chest: Effort normal and breath sounds normal.  Abdominal: Soft. Bowel sounds are normal.  Musculoskeletal: Normal range of motion.  Neurological: She is alert.  Skin: Skin is warm and dry.  Psychiatric: She has a normal mood and affect.   Radiology: No results found.  Laboratory examination:   Labs 05/05/2018: Serum glucose 104 mg, BUN 13, creatinine 1.0, eGFR 65, potassium 3.4.  CMP otherwise normal.  Total cholesterol 19, triglycerides 76, HDL 65, LDL 97.  Nonobstructive cholesterol 114.  BNP 39.  CMP Latest Ref Rng & Units 01/08/2018 05/27/2015 03/25/2015  Glucose 70 - 99 mg/dL 132(H) - -  BUN 8 - 23 mg/dL 12 - -  Creatinine 0.44 - 1.00 mg/dL 0.80 - 1.00  Sodium 135 - 145 mmol/L 138 - -  Potassium 3.5 - 5.1 mmol/L 3.4(L) - -  Chloride 98 - 111 mmol/L 100 - -  CO2 19 - 32 mEq/L - - -  Calcium 8.4 - 10.5 mg/dL - - -  Total Protein 6.0 - 8.3 g/dL - - -  Total Bilirubin 0.3 - 1.2 mg/dL - - -  Alkaline Phos 39 - 117 U/L - - -  AST 0 - 37 U/L - - -  ALT 6 - 29 U/L - 10 -   CBC Latest Ref Rng & Units 01/08/2018 02/23/2013 02/22/2013  WBC 4.0 - 10.5 K/uL - 6.5 6.2  Hemoglobin 12.0 - 15.0 g/dL 14.6 12.8 13.0  Hematocrit 36.0 - 46.0 % 43.0 37.3 37.8  Platelets 150 - 400 K/uL - 222 241   Lipid Panel     Component Value Date/Time   CHOL 171 05/27/2015 0811   TRIG 76 05/27/2015 0811   HDL 62 05/27/2015 0811   CHOLHDL 2.8 05/27/2015 0811   VLDL 15 05/27/2015 0811   LDLCALC 94 05/27/2015 0811   HEMOGLOBIN A1C No results found for: HGBA1C, MPG TSH No results for input(s): TSH in the last 8760 hours.  PRN Meds:. Medications Discontinued During This Encounter  Medication Reason  . meclizine (ANTIVERT) 12.5 MG tablet Error   Current Meds  Medication Sig  . amLODipine (NORVASC) 10 MG tablet Take 10 mg by mouth daily.  Marland Kitchen aspirin 81 MG  tablet Take 81 mg by mouth every other day.  . cholecalciferol (VITAMIN D) 400 UNITS TABS Take 2,000 Units by mouth daily.   Marland Kitchen levothyroxine (SYNTHROID, LEVOTHROID) 25 MCG tablet Take 25 mcg by mouth daily.  . Multiple Vitamin (MULTIVITAMIN) tablet Take 1 tablet by mouth daily.  . pantoprazole (PROTONIX) 40 MG tablet Take 40 mg  by mouth daily.  . potassium chloride SA (K-DUR,KLOR-CON) 20 MEQ tablet Take 20 mEq by mouth daily.  . rosuvastatin (CRESTOR) 5 MG tablet 1 tablet by mouth 2 times/ week--Dr Kevan Ny 04/29/15  . tacrolimus (PROTOPIC) 0.03 % ointment Apply 1 application topically 2 (two) times daily as needed (chapped lips).   . triamterene-hydrochlorothiazide (MAXZIDE-25) 37.5-25 MG per tablet Take 1 tablet by mouth daily.    Cardiac Studies:   None  Assessment   Non-cardiac chest pain - Plan: EKG 12-Lead  Essential hypertension  Gastroesophageal reflux disease without esophagitis  Mild hyperlipidemia  EKG 06/15/2018: Normal sinus rhythm at rate of 61 bpm, normal axis.  Incomplete right.  Normal EKG.  Recommendations:   Having chest discomfort with underlying significant stress with her husband passing away 2 months ago, mostly occurring at night and occasionally while she is walking and is relieved in 10-15 minutes.  Symptoms are probably at most atypical for angina pectoris, she is extremely concerned about coronary artery disease.  She does have risk factors including hypertension which is well-controlled along with well-controlled hyperlipidemia.  She does have GERD but states that it has been well-controlled with PPI. I reviewed her labs, lipids are also under excellent control.  In view of Covid 19, to avoid exposure, Lexiscan stress test will be performed instead of routine treadmill excess stress test.  Patient prefers not to wait as she is concerned about ongoing chest discomfort.  Unless is abnormal, I'll see her back on a p.r.n. basis.  Although blood pressure was  minimally elevated today, she brings home recordings which are under very good control.  Adrian Prows, MD, [facility] 06/15/2018, 3:40 PM [facility] [place] Pager: [phone] Office: [phone] If no answer Cell [phone]

## 2018-06-29 ENCOUNTER — Ambulatory Visit (INDEPENDENT_AMBULATORY_CARE_PROVIDER_SITE_OTHER): Payer: Medicare Other

## 2018-06-29 ENCOUNTER — Other Ambulatory Visit: Payer: Self-pay

## 2018-06-29 DIAGNOSIS — R0789 Other chest pain: Secondary | ICD-10-CM

## 2018-06-29 DIAGNOSIS — I10 Essential (primary) hypertension: Secondary | ICD-10-CM

## 2018-07-01 NOTE — Progress Notes (Signed)
Lvm for pt to call back. 

## 2018-07-01 NOTE — Progress Notes (Signed)
S/w pt advised her of test results

## 2018-07-21 ENCOUNTER — Ambulatory Visit
Admission: RE | Admit: 2018-07-21 | Discharge: 2018-07-21 | Disposition: A | Discharge status: Home / Self Care | Payer: Medicare Other | Source: Ambulatory Visit | Attending: Internal Medicine | Admitting: Internal Medicine

## 2018-07-21 DIAGNOSIS — Z1231 Encounter for screening mammogram for malignant neoplasm of breast: Secondary | ICD-10-CM

## 2019-01-10 ENCOUNTER — Other Ambulatory Visit: Payer: Self-pay | Admitting: Internal Medicine

## 2019-01-10 DIAGNOSIS — E049 Nontoxic goiter, unspecified: Secondary | ICD-10-CM

## 2019-02-01 ENCOUNTER — Ambulatory Visit
Admission: RE | Admit: 2019-02-01 | Discharge: 2019-02-01 | Disposition: A | Discharge status: Home / Self Care | Payer: Medicare Other | Source: Ambulatory Visit | Attending: Internal Medicine | Admitting: Internal Medicine

## 2019-02-01 DIAGNOSIS — E049 Nontoxic goiter, unspecified: Secondary | ICD-10-CM

## 2019-03-14 ENCOUNTER — Ambulatory Visit: Payer: Medicare PPO

## 2019-06-12 ENCOUNTER — Other Ambulatory Visit: Payer: Self-pay | Admitting: Internal Medicine

## 2019-06-12 DIAGNOSIS — Z1231 Encounter for screening mammogram for malignant neoplasm of breast: Secondary | ICD-10-CM

## 2019-07-25 ENCOUNTER — Other Ambulatory Visit: Payer: Self-pay

## 2019-07-25 ENCOUNTER — Ambulatory Visit
Admission: RE | Admit: 2019-07-25 | Discharge: 2019-07-25 | Disposition: A | Discharge status: Home / Self Care | Payer: Medicare PPO | Source: Ambulatory Visit | Attending: Internal Medicine | Admitting: Internal Medicine

## 2019-07-25 DIAGNOSIS — Z1231 Encounter for screening mammogram for malignant neoplasm of breast: Secondary | ICD-10-CM

## 2019-07-28 ENCOUNTER — Other Ambulatory Visit: Payer: Self-pay | Admitting: Internal Medicine

## 2019-07-28 DIAGNOSIS — R928 Other abnormal and inconclusive findings on diagnostic imaging of breast: Secondary | ICD-10-CM

## 2019-08-02 ENCOUNTER — Ambulatory Visit
Admission: RE | Admit: 2019-08-02 | Discharge: 2019-08-02 | Disposition: A | Discharge status: Home / Self Care | Payer: Medicare PPO | Source: Ambulatory Visit | Attending: Internal Medicine | Admitting: Internal Medicine

## 2019-08-02 ENCOUNTER — Other Ambulatory Visit: Payer: Self-pay

## 2019-08-02 DIAGNOSIS — R928 Other abnormal and inconclusive findings on diagnostic imaging of breast: Secondary | ICD-10-CM

## 2019-09-15 ENCOUNTER — Other Ambulatory Visit: Payer: Medicare PPO

## 2019-09-15 ENCOUNTER — Other Ambulatory Visit: Payer: Self-pay

## 2019-09-15 DIAGNOSIS — Z20822 Contact with and (suspected) exposure to covid-19: Secondary | ICD-10-CM

## 2019-09-16 LAB — NOVEL CORONAVIRUS, NAA: SARS-CoV-2, NAA: NOT DETECTED

## 2019-09-16 LAB — SARS-COV-2, NAA 2 DAY TAT

## 2020-01-18 ENCOUNTER — Other Ambulatory Visit: Payer: Self-pay | Admitting: Obstetrics and Gynecology

## 2020-01-18 DIAGNOSIS — M8580 Other specified disorders of bone density and structure, unspecified site: Secondary | ICD-10-CM

## 2020-05-01 ENCOUNTER — Other Ambulatory Visit: Payer: Medicare PPO

## 2020-06-19 ENCOUNTER — Other Ambulatory Visit: Payer: Self-pay | Admitting: Internal Medicine

## 2020-06-19 DIAGNOSIS — Z1231 Encounter for screening mammogram for malignant neoplasm of breast: Secondary | ICD-10-CM

## 2020-08-19 ENCOUNTER — Ambulatory Visit: Payer: Medicare PPO | Admitting: Podiatry

## 2020-08-20 ENCOUNTER — Ambulatory Visit
Admission: RE | Admit: 2020-08-20 | Discharge: 2020-08-20 | Disposition: A | Discharge status: Home / Self Care | Payer: Medicare PPO | Source: Ambulatory Visit | Attending: Internal Medicine | Admitting: Internal Medicine

## 2020-08-20 ENCOUNTER — Ambulatory Visit: Payer: Medicare PPO | Admitting: Podiatry

## 2020-08-20 ENCOUNTER — Ambulatory Visit (INDEPENDENT_AMBULATORY_CARE_PROVIDER_SITE_OTHER): Payer: Medicare PPO

## 2020-08-20 ENCOUNTER — Other Ambulatory Visit: Payer: Self-pay

## 2020-08-20 ENCOUNTER — Encounter: Payer: Self-pay | Admitting: Podiatry

## 2020-08-20 DIAGNOSIS — R194 Change in bowel habit: Secondary | ICD-10-CM | POA: Insufficient documentation

## 2020-08-20 DIAGNOSIS — G5782 Other specified mononeuropathies of left lower limb: Secondary | ICD-10-CM

## 2020-08-20 DIAGNOSIS — L603 Nail dystrophy: Secondary | ICD-10-CM | POA: Diagnosis not present

## 2020-08-20 DIAGNOSIS — M2041 Other hammer toe(s) (acquired), right foot: Secondary | ICD-10-CM

## 2020-08-20 DIAGNOSIS — Z1231 Encounter for screening mammogram for malignant neoplasm of breast: Secondary | ICD-10-CM

## 2020-08-20 DIAGNOSIS — G5762 Lesion of plantar nerve, left lower limb: Secondary | ICD-10-CM

## 2020-08-20 DIAGNOSIS — Z860100 Personal history of colon polyps, unspecified: Secondary | ICD-10-CM | POA: Insufficient documentation

## 2020-08-20 DIAGNOSIS — Z1211 Encounter for screening for malignant neoplasm of colon: Secondary | ICD-10-CM | POA: Insufficient documentation

## 2020-08-20 DIAGNOSIS — G5761 Lesion of plantar nerve, right lower limb: Secondary | ICD-10-CM | POA: Diagnosis not present

## 2020-08-20 DIAGNOSIS — M778 Other enthesopathies, not elsewhere classified: Secondary | ICD-10-CM

## 2020-08-20 DIAGNOSIS — G5781 Other specified mononeuropathies of right lower limb: Secondary | ICD-10-CM

## 2020-08-20 DIAGNOSIS — M2042 Other hammer toe(s) (acquired), left foot: Secondary | ICD-10-CM

## 2020-08-20 DIAGNOSIS — K5900 Constipation, unspecified: Secondary | ICD-10-CM | POA: Insufficient documentation

## 2020-08-20 DIAGNOSIS — K219 Gastro-esophageal reflux disease without esophagitis: Secondary | ICD-10-CM | POA: Insufficient documentation

## 2020-08-20 DIAGNOSIS — Z800 Family history of malignant neoplasm of digestive organs: Secondary | ICD-10-CM | POA: Insufficient documentation

## 2020-08-20 MED ORDER — TRIAMCINOLONE ACETONIDE 40 MG/ML IJ SUSP
40.0000 mg | Freq: Once | INTRAMUSCULAR | Status: AC
  Administered 2020-08-20: 40 mg

## 2020-08-21 NOTE — Progress Notes (Signed)
Subjective:  Patient ID: [name], female    DOB: 1945-11-23,  MRN: [id number] HPI Chief Complaint  Patient presents with   Foot Pain    Toes and forefoot bilateral (R>L) - aching x several months, HT deformities, toes cramp and get stuck in place, tried muscle rub-no help   Nail Problem    Concerned about nail fungus   New Patient (Initial Visit)    Est pt 33    75 y.o. female presents with the above complaint.   ROS: Denies fever chills nausea vomiting muscle aches pains calf pain back pain chest pain shortness of breath.  Past Medical History:  Diagnosis Date   BMI 27.0-27.9,adult    Chest pain    Esophageal reflux    Hernia, hiatal    Hyperlipidemia    Hypertension    Immunization deficiency    Low back pain    Onychomycosis    Skin lesion    Thyroid enlargement    Vitamin D deficiency    Past Surgical History:  Procedure Laterality Date   BONE SPURS REMOVAL     GALLBLADDER SURGERY      Current Outpatient Medications:    Acetaminophen (TYLENOL ARTHRITIS PAIN PO), Take by mouth., Disp: , Rfl:    amLODipine (NORVASC) 10 MG tablet, Take 10 mg by mouth daily., Disp: , Rfl:    aspirin 81 MG tablet, Take 81 mg by mouth every other day., Disp: , Rfl:    cholecalciferol (VITAMIN D) 400 UNITS TABS, Take 2,000 Units by mouth daily. , Disp: , Rfl:    levothyroxine (SYNTHROID, LEVOTHROID) 25 MCG tablet, Take 25 mcg by mouth daily., Disp: , Rfl:    Multiple Vitamin (MULTIVITAMIN) tablet, Take 1 tablet by mouth daily., Disp: , Rfl:    pantoprazole (PROTONIX) 40 MG tablet, Take 40 mg by mouth daily., Disp: , Rfl:    potassium chloride SA (K-DUR,KLOR-CON) 20 MEQ tablet, Take 20 mEq by mouth daily., Disp: , Rfl:    triamterene-hydrochlorothiazide (MAXZIDE-25) 37.5-25 MG per tablet, Take 1 tablet by mouth daily., Disp: , Rfl:   No Known Allergies Review of Systems Objective:  There were no vitals filed for this visit.  General: Well developed, nourished, in no acute  distress, alert and oriented x3   Dermatological: Skin is warm, dry and supple bilateral. Nails x 10 are well maintained; remaining integument appears unremarkable at this time. There are no open sores, no preulcerative lesions, no rash or signs of infection present.  Vascular: Dorsalis Pedis artery and Posterior Tibial artery pedal pulses are 2/4 bilateral with immedate capillary fill time. Pedal hair growth present. No varicosities and no lower extremity edema present bilateral.   Neruologic: Grossly intact via light touch bilateral. Vibratory intact via tuning fork bilateral. Protective threshold with Semmes Wienstein monofilament intact to all pedal sites bilateral. Patellar and Achilles deep tendon reflexes 2+ bilateral. No Babinski or clonus noted bilateral.  Palpable Mulder's click third interdigital space bilateral with pain.  Musculoskeletal: No gross boney pedal deformities bilateral. No pain, crepitus, or limitation noted with foot and ankle range of motion bilateral. Muscular strength 5/5 in all groups tested bilateral.  No reproducible pain on range of motion of the second metatarsophalangeal joint or third metatarsophalangeal joint.  Gait: Unassisted, Nonantalgic.    Radiographs:  Radiographs taken today demonstrate an osseously mature individual with moderate osteopenia.  No significant acute findings.  Assessment & Plan:   Assessment: Neuroma third interdigital space bilateral.    Plan: Injected 10  mg of Kenalog local anesthetic today to the third interdigital space bilateral.  We will follow-up with her in 1 month to make sure she is doing well.     Navdeep Fessenden T. [place], [place]

## 2020-09-09 ENCOUNTER — Encounter: Payer: Self-pay | Admitting: Podiatry

## 2020-09-24 ENCOUNTER — Other Ambulatory Visit: Payer: Self-pay

## 2020-09-24 ENCOUNTER — Ambulatory Visit: Payer: Medicare PPO | Admitting: Podiatry

## 2020-09-24 ENCOUNTER — Encounter: Payer: Self-pay | Admitting: Podiatry

## 2020-09-24 DIAGNOSIS — G5762 Lesion of plantar nerve, left lower limb: Secondary | ICD-10-CM

## 2020-09-24 DIAGNOSIS — G5781 Other specified mononeuropathies of right lower limb: Secondary | ICD-10-CM

## 2020-09-24 DIAGNOSIS — G5761 Lesion of plantar nerve, right lower limb: Secondary | ICD-10-CM

## 2020-09-24 DIAGNOSIS — G5782 Other specified mononeuropathies of left lower limb: Secondary | ICD-10-CM

## 2020-09-24 DIAGNOSIS — G5763 Lesion of plantar nerve, bilateral lower limbs: Secondary | ICD-10-CM | POA: Diagnosis not present

## 2020-09-24 MED ORDER — GABAPENTIN 100 MG PO CAPS: 100.0000 mg | ORAL_CAPSULE | Freq: Every day | ORAL | 3 refills | Status: DC

## 2020-09-24 NOTE — Progress Notes (Signed)
She presents today for follow-up of her neuroma third interdigital space bilaterally.  She states that it feels worse at nighttime in the daytime is not too bad even with shoe gear.  Objective: Vital signs are stable she is alert and oriented x3.  Pulses are palpable.  She still has protective sensation per Phoebe Perch monofilament.  But she is sitting with her feet always moving as they were restless.  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  Plan: At this point we will start her on gabapentin 100 mg 1 p.o. nightly I will follow-up with her in 1 month to see how she is doing with this.

## 2020-10-14 ENCOUNTER — Other Ambulatory Visit: Payer: Self-pay

## 2020-10-14 ENCOUNTER — Ambulatory Visit
Admission: RE | Admit: 2020-10-14 | Discharge: 2020-10-14 | Disposition: A | Discharge status: Home / Self Care | Payer: Medicare PPO | Source: Ambulatory Visit | Attending: Obstetrics and Gynecology | Admitting: Obstetrics and Gynecology

## 2020-10-14 DIAGNOSIS — M8580 Other specified disorders of bone density and structure, unspecified site: Secondary | ICD-10-CM

## 2020-10-22 ENCOUNTER — Other Ambulatory Visit: Payer: Self-pay

## 2020-10-22 ENCOUNTER — Ambulatory Visit: Payer: Medicare PPO | Admitting: Podiatry

## 2020-10-22 DIAGNOSIS — L603 Nail dystrophy: Secondary | ICD-10-CM

## 2020-10-22 MED ORDER — NEOMYCIN-POLYMYXIN-HC 1 % OT SOLN: OTIC | 1 refills | Status: DC

## 2020-10-22 NOTE — Progress Notes (Signed)
She presents today for 1 month follow-up of her gabapentin states that it has helped dramatically with her back and her feet particularly at nighttime.  Still gets some soreness during the daytime.  She is also concerned about the nail pathology from previous evaluation.  Objective: Vital signs are stable alert oriented x3 no change in physical exam pathology does demonstrate subungual bacterial colonization nonspecific type.  Assessment: Well-controlled neuropathic symptoms.  Also bacterial colonization beneath the nail plate hallux.  Plan: At this point she is on the continue gabapentin 100 mg at bedtime.  She will also start Corticosporin otic to be applied beneath the free margin of the nail and we will follow-up with her in 3 months for nail check as well as a med check.

## 2020-12-12 ENCOUNTER — Other Ambulatory Visit: Payer: Self-pay | Admitting: Podiatry

## 2020-12-12 NOTE — Telephone Encounter (Signed)
Please advise 

## 2021-01-14 ENCOUNTER — Ambulatory Visit: Payer: Medicare PPO | Admitting: Podiatry

## 2021-01-14 ENCOUNTER — Other Ambulatory Visit: Payer: Self-pay

## 2021-01-14 ENCOUNTER — Encounter: Payer: Self-pay | Admitting: Podiatry

## 2021-01-14 DIAGNOSIS — G5793 Unspecified mononeuropathy of bilateral lower limbs: Secondary | ICD-10-CM | POA: Diagnosis not present

## 2021-01-14 DIAGNOSIS — L603 Nail dystrophy: Secondary | ICD-10-CM

## 2021-01-14 MED ORDER — NEOMYCIN-POLYMYXIN-HC 1 % OT SOLN: OTIC | 11 refills | Status: AC

## 2021-01-14 NOTE — Progress Notes (Signed)
She presents today states that she has been using her gabapentin and her Corticosporin otic.  She states both are working very well she feels that she can handle it from here she states.  Objective: Vital signs are stable alert orient x3 nail plates are healing very nicely they are growing out perfectly.  Assessment: Resolving neuritis with the use of the gabapentin at nighttime.  And resolving bacterial infection of the nail.  Plan: Refill Corticosporin otic.  Should she call and request refills on her gabapentin we will do so

## 2021-02-03 ENCOUNTER — Other Ambulatory Visit: Payer: Self-pay | Admitting: Podiatry

## 2021-02-03 NOTE — Telephone Encounter (Signed)
Please advise 

## 2021-03-11 ENCOUNTER — Other Ambulatory Visit: Payer: Self-pay

## 2021-03-11 ENCOUNTER — Encounter: Payer: Self-pay | Admitting: Podiatry

## 2021-03-11 ENCOUNTER — Ambulatory Visit (INDEPENDENT_AMBULATORY_CARE_PROVIDER_SITE_OTHER): Payer: Medicare PPO

## 2021-03-11 ENCOUNTER — Ambulatory Visit: Payer: Medicare PPO | Admitting: Podiatry

## 2021-03-11 DIAGNOSIS — M722 Plantar fascial fibromatosis: Secondary | ICD-10-CM | POA: Diagnosis not present

## 2021-03-11 MED ORDER — TRIAMCINOLONE ACETONIDE 40 MG/ML IJ SUSP
20.0000 mg | Freq: Once | INTRAMUSCULAR | Status: AC
  Administered 2021-03-11: 20 mg

## 2021-03-11 NOTE — Patient Instructions (Signed)

## 2021-03-11 NOTE — Progress Notes (Signed)
She presents today with a new problem chief concern plantar heel pain left states that it started about a month ago and she has tried walking for exercise and particularly yesterday and it really acted up.  She is done continue to wear her tennis shoes regularly.  Objective: Vital signs are stable she alert and oriented x3.  Pulses are palpable.  She has some tenderness on direct palpation of the plantar calcaneal tubercle radiographs today do demonstrate [place] distally oriented calcaneal spurring with some soft tissue increase in density around the spur.  This looks more like bursitis than an actual fasciitis in particular on palpation it does correlate.  Assessment: Probable bursitis of the plantar calcaneus possible plantar fasciitis.  Plan: I injected the area today with Kenalog and local anesthetic she tolerated the procedure well we discussed exercise ice therapy shoe modifications follow-up with me if this does not resolve.

## 2021-05-06 ENCOUNTER — Other Ambulatory Visit: Payer: Self-pay | Admitting: Internal Medicine

## 2021-05-06 ENCOUNTER — Ambulatory Visit
Admission: RE | Admit: 2021-05-06 | Discharge: 2021-05-06 | Disposition: A | Discharge status: Home / Self Care | Payer: Medicare PPO | Source: Ambulatory Visit | Attending: Internal Medicine | Admitting: Internal Medicine

## 2021-05-06 DIAGNOSIS — M545 Low back pain, unspecified: Secondary | ICD-10-CM

## 2021-05-06 DIAGNOSIS — M4316 Spondylolisthesis, lumbar region: Secondary | ICD-10-CM

## 2021-05-06 DIAGNOSIS — E042 Nontoxic multinodular goiter: Secondary | ICD-10-CM

## 2021-05-07 ENCOUNTER — Other Ambulatory Visit: Payer: Self-pay | Admitting: Internal Medicine

## 2021-05-07 DIAGNOSIS — M545 Low back pain, unspecified: Secondary | ICD-10-CM

## 2021-05-07 DIAGNOSIS — M479 Spondylosis, unspecified: Secondary | ICD-10-CM

## 2021-05-08 ENCOUNTER — Ambulatory Visit
Admission: RE | Admit: 2021-05-08 | Discharge: 2021-05-08 | Disposition: A | Discharge status: Home / Self Care | Payer: Medicare PPO | Source: Ambulatory Visit | Attending: Internal Medicine | Admitting: Internal Medicine

## 2021-05-08 DIAGNOSIS — E042 Nontoxic multinodular goiter: Secondary | ICD-10-CM

## 2021-06-03 ENCOUNTER — Other Ambulatory Visit: Payer: Self-pay | Admitting: Podiatry

## 2021-07-09 ENCOUNTER — Other Ambulatory Visit: Payer: Self-pay | Admitting: Internal Medicine

## 2021-07-09 DIAGNOSIS — Z1231 Encounter for screening mammogram for malignant neoplasm of breast: Secondary | ICD-10-CM

## 2021-08-22 ENCOUNTER — Ambulatory Visit
Admission: RE | Admit: 2021-08-22 | Discharge: 2021-08-22 | Disposition: A | Discharge status: Home / Self Care | Payer: Medicare PPO | Source: Ambulatory Visit | Attending: Internal Medicine | Admitting: Internal Medicine

## 2021-08-22 DIAGNOSIS — Z1231 Encounter for screening mammogram for malignant neoplasm of breast: Secondary | ICD-10-CM

## 2022-02-25 ENCOUNTER — Encounter: Payer: Self-pay | Admitting: Cardiology

## 2022-02-25 ENCOUNTER — Ambulatory Visit: Payer: Medicare PPO | Admitting: Cardiology

## 2022-02-25 VITALS — BP 123/79 | HR 67 | Resp 16 | Ht 67.0 in | Wt 176.5 lb

## 2022-02-25 DIAGNOSIS — I10 Essential (primary) hypertension: Secondary | ICD-10-CM

## 2022-02-25 DIAGNOSIS — E7800 Pure hypercholesterolemia, unspecified: Secondary | ICD-10-CM

## 2022-02-25 DIAGNOSIS — R002 Palpitations: Secondary | ICD-10-CM

## 2022-02-25 DIAGNOSIS — R0609 Other forms of dyspnea: Secondary | ICD-10-CM

## 2022-02-25 NOTE — Progress Notes (Signed)
Primary Physician/Referring:  Rogers Blocker, MD  Patient ID: [name], female    DOB: 10-Jun-1945, 77 y.o.   MRN: [id number]  Chief Complaint  Patient presents with   Shortness of Breath   Primary hypertension   Pure hypercholesterolemia   New Patient (Initial Visit)   HPI:    [name]  is a 77 y.o. female patient who I had seen in 2020, for chest pain, I advised the chest pain was noncardiac after I evaluated her prior stress test and coronary CT angiogram.  She has history of hypertension, hypercholesterolemia, vitamin D deficiency and GERD.  She is now referred back for evaluation of gradually worsening dyspnea on exertion, reduced physical tolerance and palpitations.  Patient states that since her husband passed away, she has markedly reduced her activity physically and she will been very concerned and anxious about day-to-day activities and issues that come around the house and relationships.  She has noticed gradual decrease in physical capacity and also mild dyspnea and also notices palpitations.  No PND or orthopnea or chest pain.  Palpitations occur mostly when she is laying down at night and last a few seconds.  Past Medical History:  Diagnosis Date   BMI 27.0-27.9,adult    Chest pain    Esophageal reflux    Hernia, hiatal    Hyperlipidemia    Hypertension    Immunization deficiency    Low back pain    Onychomycosis    Skin lesion    Thyroid enlargement    Vitamin D deficiency    Past Surgical History:  Procedure Laterality Date   BONE SPURS REMOVAL     GALLBLADDER SURGERY     Family History  Problem Relation Age of Onset   Breast cancer Mother 48   Cancer - Colon Mother    Liver cancer Mother    Parkinson's disease Father    Cancer - Colon Other        1981   Cancer Other        LIVER 2000   Cancer Sister        UTERINE   Cancer - Prostate Brother        AGE 90    Social History   Tobacco Use   Smoking status: Never   Smokeless tobacco:  Never  Substance Use Topics   Alcohol use: No    Alcohol/week: 0.0 standard drinks of alcohol   Marital Status: Widowed  ROS  Review of Systems  Cardiovascular:  Positive for dyspnea on exertion and palpitations. Negative for chest pain and leg swelling.   Objective      02/25/2022   11:17 AM 06/15/2018    2:11 PM 01/08/2018    3:36 PM  Vitals with BMI  Height 5\' 7"  5\' 7"    Weight 176 lbs 8 oz 164 lbs   BMI 69.62 95.28   Systolic 413 244 010  Diastolic 79 79 68  Pulse 67 64 57   SpO2: 98 %  Physical Exam Constitutional:      Comments: Much younger looking than stated age  Neck:     Vascular: No carotid bruit or JVD.  Cardiovascular:     Rate and Rhythm: Normal rate and regular rhythm.     Pulses: Intact distal pulses.     Heart sounds: Normal heart sounds. No murmur heard.    No gallop.  Pulmonary:     Effort: Pulmonary effort is normal.     Breath sounds:  Normal breath sounds.  Abdominal:     General: Bowel sounds are normal.     Palpations: Abdomen is soft.  Musculoskeletal:     Right lower leg: No edema.     Left lower leg: No edema.     Medications and allergies  No Known Allergies   Medication list   Current Outpatient Medications:    Acetaminophen (TYLENOL ARTHRITIS PAIN PO), Take by mouth., Disp: , Rfl:    amLODipine (NORVASC) 10 MG tablet, Take 10 mg by mouth daily., Disp: , Rfl:    aspirin 81 MG tablet, Take 81 mg by mouth every other day., Disp: , Rfl:    Calcium Carbonate-Vitamin D 600-3.125 MG-MCG TABS, Take 1 tablet by mouth daily., Disp: , Rfl:    Cholecalciferol 50 MCG (2000 UT) CAPS, Take 1 capsule by mouth daily., Disp: , Rfl:    ezetimibe (ZETIA) 10 MG tablet, Take 10 mg by mouth daily., Disp: , Rfl:    gabapentin (NEURONTIN) 100 MG capsule, TAKE 1 CAPSULE(100 MG) BY MOUTH AT BEDTIME, Disp: 30 capsule, Rfl: 3   levothyroxine (SYNTHROID, LEVOTHROID) 25 MCG tablet, Take 25 mcg by mouth daily., Disp: , Rfl:    METOPROLOL SUCCINATE ER PO,  12.5 mg daily., Disp: , Rfl:    Multiple Vitamin (MULTIVITAMIN) tablet, Take 1 tablet by mouth daily., Disp: , Rfl:    NEOMYCIN-POLYMYXIN-HYDROCORTISONE (CORTISPORIN) 1 % SOLN OTIC solution, Apply 1-2 drops to toe BID, Disp: 10 mL, Rfl: 11   pantoprazole (PROTONIX) 40 MG tablet, Take 40 mg by mouth daily., Disp: , Rfl:    Potassium Chloride ER 20 MEQ TBCR, Take by mouth., Disp: , Rfl:    triamterene-hydrochlorothiazide (MAXZIDE-25) 37.5-25 MG per tablet, Take 1 tablet by mouth daily., Disp: , Rfl:  Laboratory examination:   External labs:   Labs 02/03/2022:  NT proBNP 49, serum troponin <0.01.  Total CK 87.  Isoenzymes negative.  Hb 14.7/HCT 42.1, platelets 293.  Normal indicis.  Labs 09/24/2021:  TSH, T3 and T4 normal.  Total cholesterol 246, triglycerides 120, HDL 31, LDL 151.  Non-HDL cholesterol 175.  Serum glucose 91 mg, BUN 15, creatinine 0.86, EGFR 71, potassium 4.0, LFTs normal.   Radiology:   Chest x-ray PA and lateral view 05/04/2018: The heart size and mediastinal contours are within normal limits. Both lungs are clear. The visualized skeletal structures are unremarkable. Cardiac Studies:   Coronary CT angiogram Apr 14, 2015: Tiny hiatal hernia. Minimal scattered pulmonary parenchymal scarring and dependent atelectasis. Extracardiac structures are otherwise unremarkable. Coronary artery calcium score of 78 Agatston units. This places the patient in the 73rd percentile for age and gender. This suggests intermediate to high risk for future cardiac events. Coronary Arteries: Right dominant with no anomalies LM:  Mixed plaque without significant stenosis. LAD system: Calcified plaque at the ostial LAD. Suspect only mild stenosis. Circumflex system: Small OM1 and OM2, moderate PLOM. No significant plaque or stenosis in LCx system. RCA:  No significant plaque or stenosis.  Lexiscan Myoview Stress Test 06/29/2018: Stress EKG is non-diagnostic, as this is pharmacological  stress test. Myocardial pefusion imaging is normal. Left ventricular ejection fraction is  70% with normal wall motion. Low risk study.  EKG:   EKG 02/15/2022: Normal sinus rhythm at the rate of 62 bpm, left axis deviation, left anterior fascicular block.  Incomplete right bundle branch block.  No evidence of ischemia.  Compared to 06/23/2018, no change.    Assessment     ICD-10-CM   1. Dyspnea on exertion  R06.09 EKG 12-Lead    2. Palpitations  R00.2     3. Primary hypertension  I10     4. Pure hypercholesterolemia  E78.00       Orders Placed This Encounter  Procedures   EKG 12-Lead   No orders of the defined types were placed in this encounter.  There are no discontinued medications.   Recommendations:   [name] is a 77 y.o. female patient who I had seen in 2020, for chest pain, I advised the chest pain was noncardiac after I evaluated her prior stress test and coronary CT angiogram.  She has history of hypertension, hypercholesterolemia, vitamin D deficiency and GERD.  She is now referred back for evaluation of gradually worsening dyspnea on exertion and palpitations.  1. Dyspnea on exertion Upon discussions with the patient, since her husband's passing away, she is slowed down significantly and I suspect the complaint of deconditioning and do not suspect either pulmonary embolism, acute or chronic diastolic heart failure, cardiomyopathy or coronary artery disease.  Her risk factors are well-controlled.  She looks well for age and continues to remain active and has been doing Zumba classes 2-3 times a week.  Advised her to increase her physical activity and at least to either stationary bicycle or an elliptical to improve her cardiac status and to call me if she notices continued persistence of dyspnea and fatigue.  Patient was in agreement with this.  2. Palpitations Symptoms of palpitation appear to suggest PACs and PVCs.  Mostly occurring at night, lasting a few seconds.   She has very poor sleeping habits, watches TV until 11:30 at night and gets away early in the morning around 430.  She has also been disturbed and anxious being alone since her husband's death.  Part of anxiety and poor sleeping habits are contributing to her symptoms, do not suspect A-fib or any other significant arrhythmias.  I reassured her.  3. Primary hypertension Blood pressure under excellent control, hence no indication for making any changes to her medicines.  4. Pure hypercholesterolemia Lipids are also well-controlled.  Overall her risk factors are well-controlled, in a patient who is otherwise doing well and not on diabetic and non-smoker, very low suspicion for CAD.  Low suspicion for PE or significant pulmonary issues as well.  As dictated above she will contact me after increasing her physical activity she continues to have persistent symptoms.  She was very pleased with the evaluation and discussion today.  I also reviewed previously performed echocardiogram and stress test with the patient and updated her labs.     Adrian Prows, MD, [facility] 02/25/2022, 12:05 PM Office: [phone]

## 2022-04-22 ENCOUNTER — Ambulatory Visit (HOSPITAL_COMMUNITY)
Admission: EM | Admit: 2022-04-22 | Discharge: 2022-04-22 | Disposition: A | Discharge status: Home / Self Care | Payer: Medicare PPO | Attending: Emergency Medicine | Admitting: Emergency Medicine

## 2022-04-22 ENCOUNTER — Ambulatory Visit (INDEPENDENT_AMBULATORY_CARE_PROVIDER_SITE_OTHER): Payer: Medicare PPO

## 2022-04-22 ENCOUNTER — Encounter (HOSPITAL_COMMUNITY): Payer: Self-pay

## 2022-04-22 DIAGNOSIS — R051 Acute cough: Secondary | ICD-10-CM

## 2022-04-22 DIAGNOSIS — R0602 Shortness of breath: Secondary | ICD-10-CM

## 2022-04-22 DIAGNOSIS — J069 Acute upper respiratory infection, unspecified: Secondary | ICD-10-CM | POA: Diagnosis not present

## 2022-04-22 DIAGNOSIS — R059 Cough, unspecified: Secondary | ICD-10-CM | POA: Diagnosis not present

## 2022-04-22 MED ORDER — DOXYCYCLINE HYCLATE 100 MG PO CAPS: 100.0000 mg | ORAL_CAPSULE | Freq: Two times a day (BID) | ORAL | 0 refills | Status: AC

## 2022-04-22 MED ORDER — BENZONATATE 100 MG PO CAPS: 100.0000 mg | ORAL_CAPSULE | Freq: Three times a day (TID) | ORAL | 0 refills | Status: AC

## 2022-04-22 NOTE — Discharge Instructions (Addendum)
Please take all antibiotics as prescribed and until finished.  You can take them with food to help prevent stomach upset.  You can take the [facility] every 8 hours as needed for cough.  Please continue to take 1200 mg of Mucinex as well as drinking 64 ounces of water to help loosen your secretions.  Please follow-up with your primary care or return to clinic if no improvement over the next 7 days, you develop fever, chest pain, shortness of breath or any worsening of symptoms.

## 2022-04-22 NOTE — ED Provider Notes (Signed)
[facility]    CSN: [id number] Arrival date & time: 04/22/22  1108      History   Chief Complaint Chief Complaint  Patient presents with   Cough    HPI [name] is a 77 y.o. female.   Patient presents to clinic for productive cough, sore throat, nasal congestion and chest soreness after coughing for the past 4 days.  She has taken a COVID and flu test at home that were both negative.  She has been taking Mucinex which helps some with her drainage, but productive cough remains. She denies chest pain or shortness of breath.     The history is provided by the patient.  Cough Associated symptoms: sore throat   Associated symptoms: no chest pain, no eye discharge, no fever, no shortness of breath and no wheezing     Past Medical History:  Diagnosis Date   BMI 27.0-27.9,adult    Chest pain    Esophageal reflux    Hernia, hiatal    Hyperlipidemia    Hypertension    Immunization deficiency    Low back pain    Onychomycosis    Skin lesion    Thyroid enlargement    Vitamin D deficiency     Patient Active Problem List   Diagnosis Date Noted   Change in bowel habit 08/20/2020   Colon cancer screening 08/20/2020   Constipation 08/20/2020   Family history of malignant neoplasm of digestive organs 08/20/2020   Gastroesophageal reflux disease 08/20/2020   Personal history of colonic polyps 08/20/2020   Hypertension    Hyperlipidemia    Onychomycosis    Thyroid enlargement    BMI 27.0-27.9,adult    Immunization deficiency    Low back pain    Skin lesion    Hernia, hiatal    Vitamin D deficiency    Non-cardiac chest pain 02/22/2013    Past Surgical History:  Procedure Laterality Date   BONE SPURS REMOVAL     GALLBLADDER SURGERY      OB History   No obstetric history on file.      Home Medications    Prior to Admission medications   Medication Sig Start Date End Date Taking? Authorizing Provider  benzonatate (TESSALON) 100 MG capsule Take  1 capsule (100 mg total) by mouth every 8 (eight) hours. 04/22/22  Yes Louretta Shorten, [place], FNP  doxycycline (VIBRAMYCIN) 100 MG capsule Take 1 capsule (100 mg total) by mouth 2 (two) times daily. 04/22/22  Yes Louretta Shorten, [place], FNP  Acetaminophen (TYLENOL ARTHRITIS PAIN PO) Take by mouth.    [provider]  amLODipine (NORVASC) 10 MG tablet Take 10 mg by mouth daily.    [provider]  aspirin 81 MG tablet Take 81 mg by mouth every other day.    [provider]  Calcium Carbonate-Vitamin D 600-3.125 MG-MCG TABS Take 1 tablet by mouth daily.    [provider]  Cholecalciferol 50 MCG (2000 UT) CAPS Take 1 capsule by mouth daily.    [provider]  ezetimibe (ZETIA) 10 MG tablet Take 10 mg by mouth daily.    [provider]  gabapentin (NEURONTIN) 100 MG capsule TAKE 1 CAPSULE(100 MG) BY MOUTH AT BEDTIME 06/03/21   Hyatt, Max T, DPM  levothyroxine (SYNTHROID, LEVOTHROID) 25 MCG tablet Take 25 mcg by mouth daily.    [provider]  METOPROLOL SUCCINATE ER PO 12.5 mg daily. 01/13/21   [provider]  Multiple Vitamin (MULTIVITAMIN) tablet  Take 1 tablet by mouth daily.    [provider]  NEOMYCIN-POLYMYXIN-HYDROCORTISONE (CORTISPORIN) 1 % SOLN OTIC solution Apply 1-2 drops to toe BID 01/14/21   Hyatt, Max T, DPM  pantoprazole (PROTONIX) 40 MG tablet Take 40 mg by mouth daily.    [provider]  Potassium Chloride ER 20 MEQ TBCR Take by mouth. 02/03/21   [provider]  triamterene-hydrochlorothiazide (MAXZIDE-25) 37.5-25 MG per tablet Take 1 tablet by mouth daily.    [provider]  atorvastatin (LIPITOR) 10 MG tablet Take 1 tablet (10 mg total) by mouth daily. 03/26/15 07/15/15  Belva Crome, MD    Family History Family History  Problem Relation Age of Onset   Breast cancer Mother 19   Cancer - Colon Mother    Liver cancer Mother    Parkinson's disease Father    Cancer - Colon  Other        1981   Cancer Other        LIVER 2000   Cancer Sister        UTERINE   Cancer - Prostate Brother        AGE 37    Social History Social History   Tobacco Use   Smoking status: Never   Smokeless tobacco: Never  Vaping Use   Vaping Use: Never used  Substance Use Topics   Alcohol use: No    Alcohol/week: 0.0 standard drinks of alcohol     Allergies   Patient has no known allergies.   Review of Systems Review of Systems  Constitutional:  Positive for fatigue. Negative for fever.  HENT:  Positive for sore throat and voice change.   Eyes:  Negative for discharge and itching.  Respiratory:  Positive for cough. Negative for shortness of breath and wheezing.   Cardiovascular:  Negative for chest pain.  Gastrointestinal:  Negative for abdominal pain.  Genitourinary:  Negative for dysuria.  Musculoskeletal:  Negative for back pain.     Physical Exam Triage Vital Signs ED Triage Vitals  Enc Vitals Group     BP 04/22/22 1142 (!) 171/76     Pulse Rate 04/22/22 1142 75     Resp 04/22/22 1142 18     Temp 04/22/22 1142 98.7 F (37.1 C)     Temp Source 04/22/22 1142 Oral     SpO2 04/22/22 1142 96 %     Weight --      Height --      Head Circumference --      Peak Flow --      Pain Score 04/22/22 1143 0     Pain Loc --      Pain Edu? --      Excl. in [place]? --    No data found.  Updated Vital Signs BP (!) 171/76 (BP Location: Left Arm)   Pulse 75   Temp 98.7 F (37.1 C) (Oral)   Resp 18   SpO2 96%   Visual Acuity Right Eye Distance:   Left Eye Distance:   Bilateral Distance:    Right Eye Near:   Left Eye Near:    Bilateral Near:     Physical Exam Vitals and nursing note reviewed.  Constitutional:      General: She is not in acute distress.    Appearance: She is well-developed.  HENT:     Head: Normocephalic and atraumatic.     Right Ear: External ear normal.     Left Ear: External ear  normal.     Nose: Nose normal.     Mouth/Throat:      Mouth: Mucous membranes are moist.     Pharynx: Posterior oropharyngeal erythema present.  Eyes:     Conjunctiva/sclera: Conjunctivae normal.  Cardiovascular:     Rate and Rhythm: Normal rate and regular rhythm.     Heart sounds: Normal heart sounds, S1 normal and S2 normal. No murmur heard. Pulmonary:     Effort: Pulmonary effort is normal. No respiratory distress.     Breath sounds: Normal breath sounds. Decreased air movement present.  Musculoskeletal:        General: No swelling. Normal range of motion.     Cervical back: Normal range of motion and neck supple.  Lymphadenopathy:     Cervical: Cervical adenopathy present.  Skin:    General: Skin is warm and dry.     Capillary Refill: Capillary refill takes less than 2 seconds.  Neurological:     Mental Status: She is alert and oriented to person, place, and time.  Psychiatric:        Mood and Affect: Mood normal.        Behavior: Behavior is cooperative.      UC Treatments / Results  Labs (all labs ordered are listed, but only abnormal results are displayed) Labs Reviewed - No data to display  EKG   Radiology DG Chest 2 View  Result Date: 04/22/2022 CLINICAL DATA:  Cough, shortness of breath EXAM: CHEST - 2 VIEW COMPARISON:  05/04/2018 FINDINGS: The heart size and mediastinal contours are within normal limits. Mild diffuse interstitial pulmonary opacity. The visualized skeletal structures are unremarkable. IMPRESSION: Mild diffuse interstitial pulmonary opacity, consistent with edema or atypical/viral infection. No focal airspace opacity. Electronically Signed   By: Delanna Ahmadi M.D.   On: 04/22/2022 12:28    Procedures Procedures (including critical care time)  Medications Ordered in UC Medications - No data to display  Initial Impression / Assessment and Plan / UC Course  I have reviewed the triage vital signs and the nursing notes.  Pertinent labs & imaging results that were available during my care of the  patient were reviewed by me and considered in my medical decision making (see chart for details).  Vitals in triage reviewed, patient is hypertensive with history of same.  Lungs vesicular but slightly diminished in all lobes, chest x-ray obtained and showed mild diffuse interstitial pulmonary opacity, consistent with edema or atypical/viral infection. No focal airspace opacity.  Due to duration of symptoms and no improvement, will cover with doxycycline for potential bacterial etiology due to age and comorbidities. POC, return precautions and follow-up care discussed, patient verbalized understanding, no questions at this time.     Final Clinical Impressions(s) / UC Diagnoses   Final diagnoses:  Upper respiratory tract infection, unspecified type  Acute cough     Discharge Instructions      Please take all antibiotics as prescribed and until finished.  You can take them with food to help prevent stomach upset.  You can take the [facility] every 8 hours as needed for cough.  Please continue to take 1200 mg of Mucinex as well as drinking 64 ounces of water to help loosen your secretions.  Please follow-up with your primary care or return to clinic if no improvement over the next 7 days, you develop fever, chest pain, shortness of breath or any worsening of symptoms.      ED Prescriptions     Medication  Sig Dispense Auth. Provider   doxycycline (VIBRAMYCIN) 100 MG capsule Take 1 capsule (100 mg total) by mouth 2 (two) times daily. 20 capsule Louretta Shorten, [place], [place]   benzonatate (TESSALON) 100 MG capsule Take 1 capsule (100 mg total) by mouth every 8 (eight) hours. 21 capsule Kimmi Acocella, [place], [place]      PDMP not reviewed this encounter.   Derrian Poli, [place], [place] 04/22/22 1332

## 2022-04-22 NOTE — ED Triage Notes (Signed)
Pt c/o productive cough, sore throat, congestion, and watery eyes x4 days. States had a neg COVID and flu test from [employer] [facility]. Taking OTC meds with no results.

## 2022-06-29 ENCOUNTER — Other Ambulatory Visit: Payer: Self-pay | Admitting: Internal Medicine

## 2022-06-29 DIAGNOSIS — E042 Nontoxic multinodular goiter: Secondary | ICD-10-CM

## 2022-07-10 ENCOUNTER — Other Ambulatory Visit: Payer: Self-pay | Admitting: Internal Medicine

## 2022-07-10 DIAGNOSIS — Z0000 Encounter for general adult medical examination without abnormal findings: Secondary | ICD-10-CM

## 2022-07-27 ENCOUNTER — Ambulatory Visit
Admission: RE | Admit: 2022-07-27 | Discharge: 2022-07-27 | Disposition: A | Discharge status: Home / Self Care | Payer: Medicare PPO | Source: Ambulatory Visit | Attending: Internal Medicine | Admitting: Internal Medicine

## 2022-07-27 DIAGNOSIS — E042 Nontoxic multinodular goiter: Secondary | ICD-10-CM

## 2022-08-25 ENCOUNTER — Ambulatory Visit
Admission: RE | Admit: 2022-08-25 | Discharge: 2022-08-25 | Disposition: A | Discharge status: Home / Self Care | Payer: Medicare PPO | Source: Ambulatory Visit | Attending: Internal Medicine | Admitting: Internal Medicine

## 2022-08-25 DIAGNOSIS — Z0000 Encounter for general adult medical examination without abnormal findings: Secondary | ICD-10-CM

## 2022-09-27 IMAGING — MG MM DIGITAL SCREENING BILAT W/ TOMO AND CAD
8 series · 8 of 24 positions shown · non-contrast
Comparison: Previous exam(s).

CLINICAL DATA: Screening.

EXAM:
DIGITAL SCREENING BILATERAL MAMMOGRAM WITH TOMOSYNTHESIS AND CAD
TECHNIQUE: Bilateral screening digital craniocaudal and mediolateral oblique
mammograms were obtained. Bilateral screening digital breast
tomosynthesis was performed. The images were evaluated with
computer-aided detection.

[L MLO synth-2D]
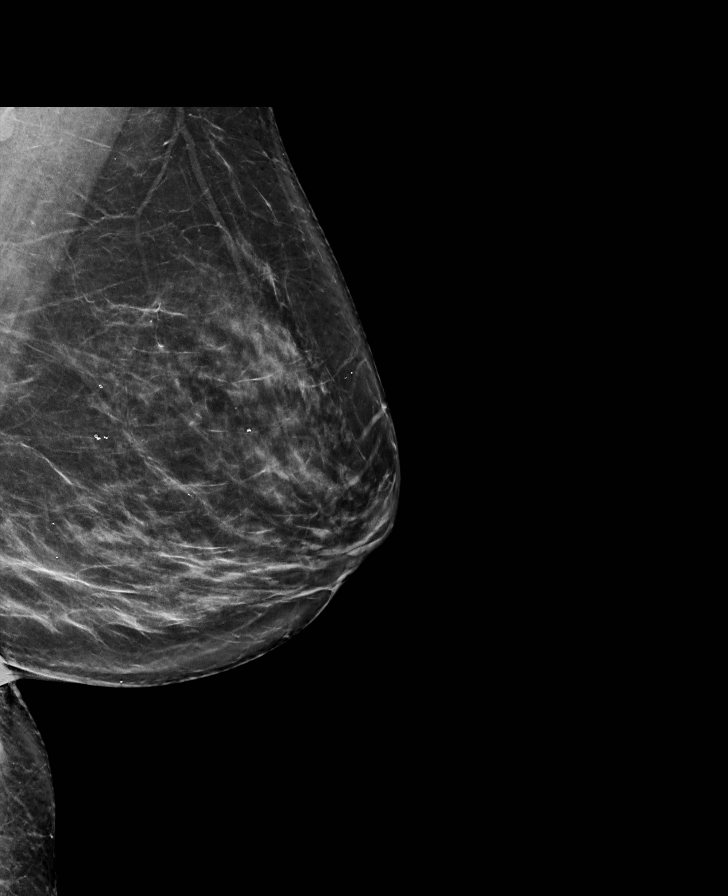

[R MLO synth-2D]
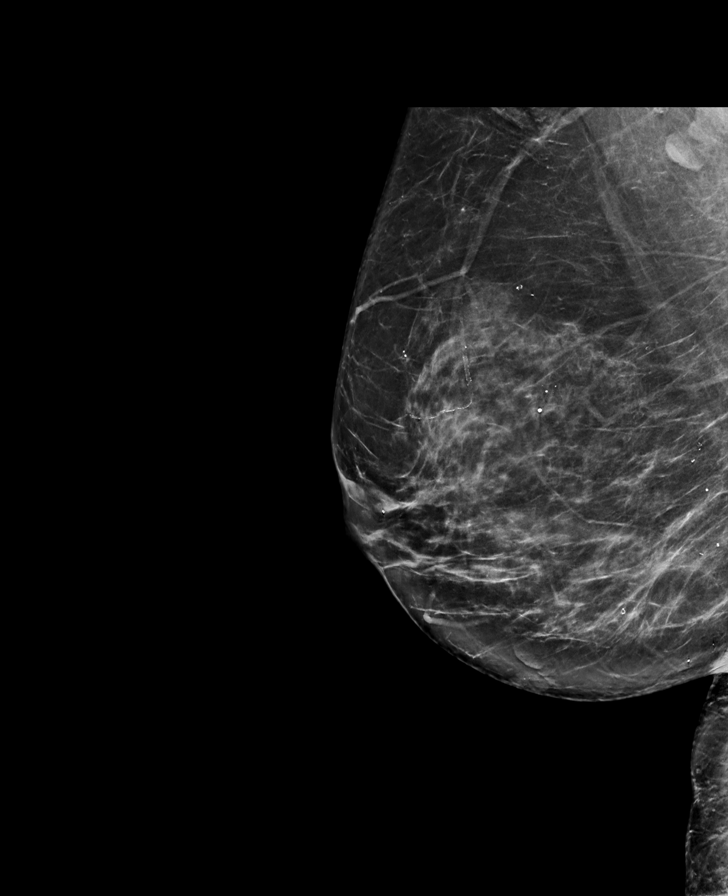

[R CC synth-2D]
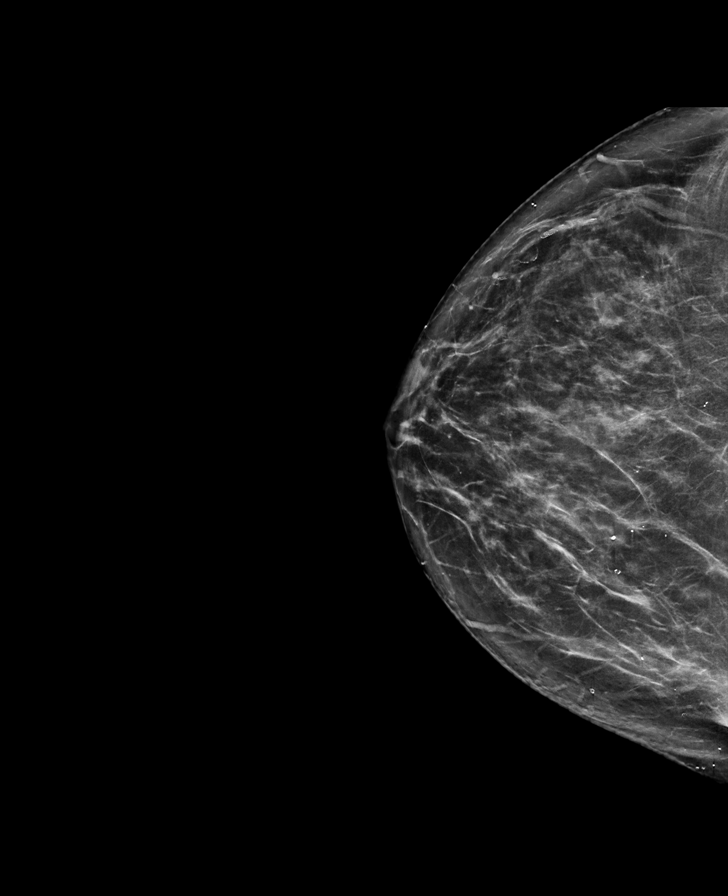

[L CC synth-2D]
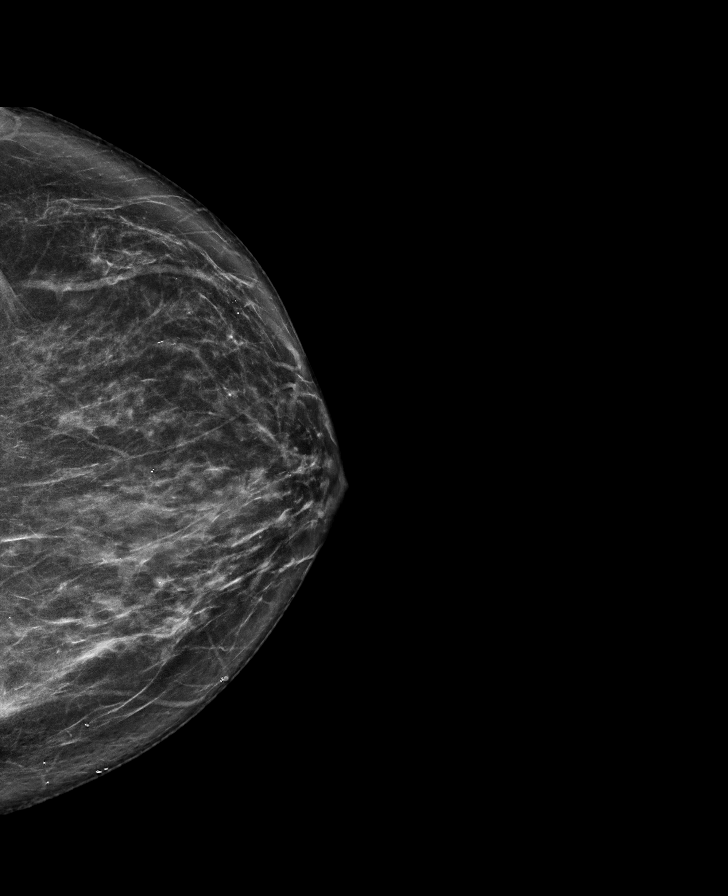

[R MLO tomo · tomo slice 45/89.0]
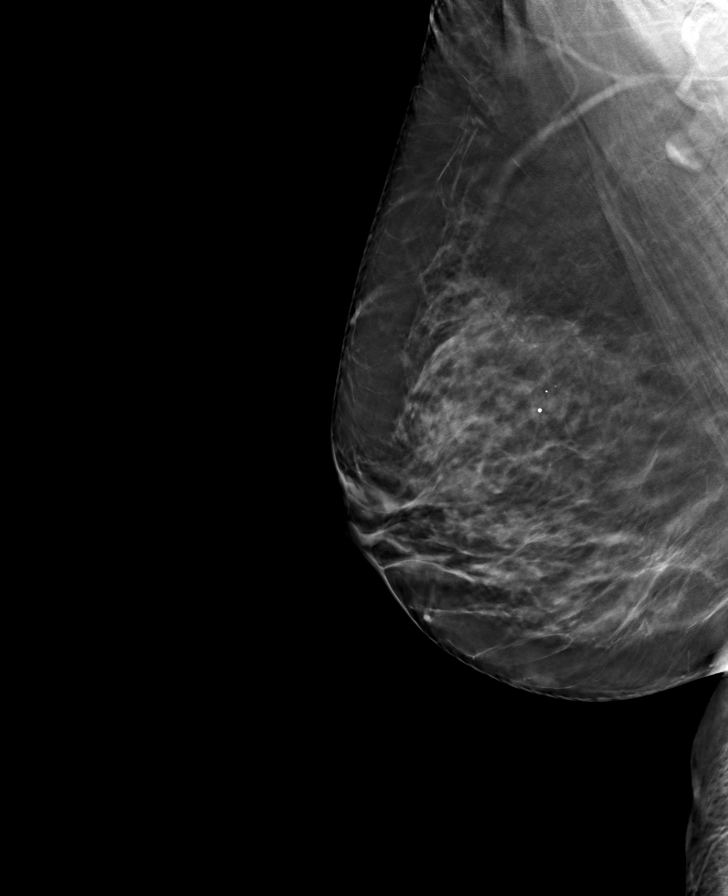

[R CC tomo · tomo slice 42/83.0]
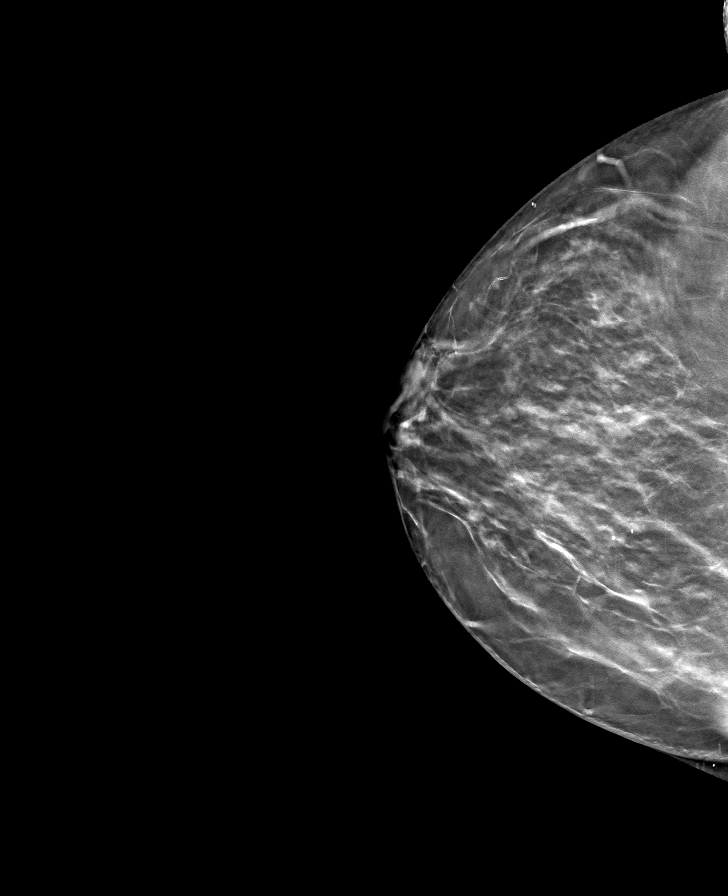

[L MLO tomo · tomo slice 42/83.0]
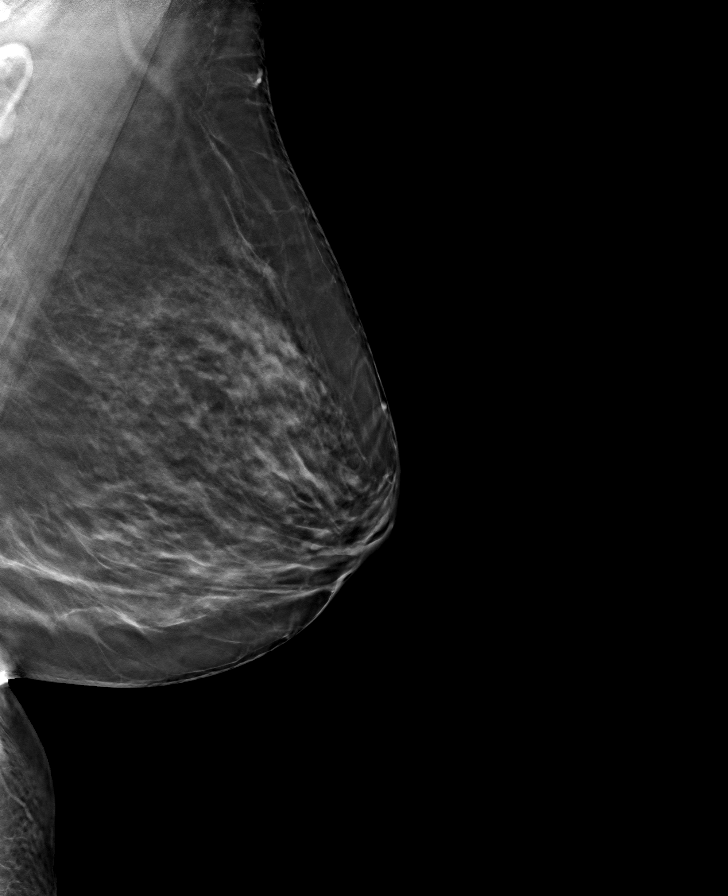

[L CC tomo · tomo slice 39/77.0]
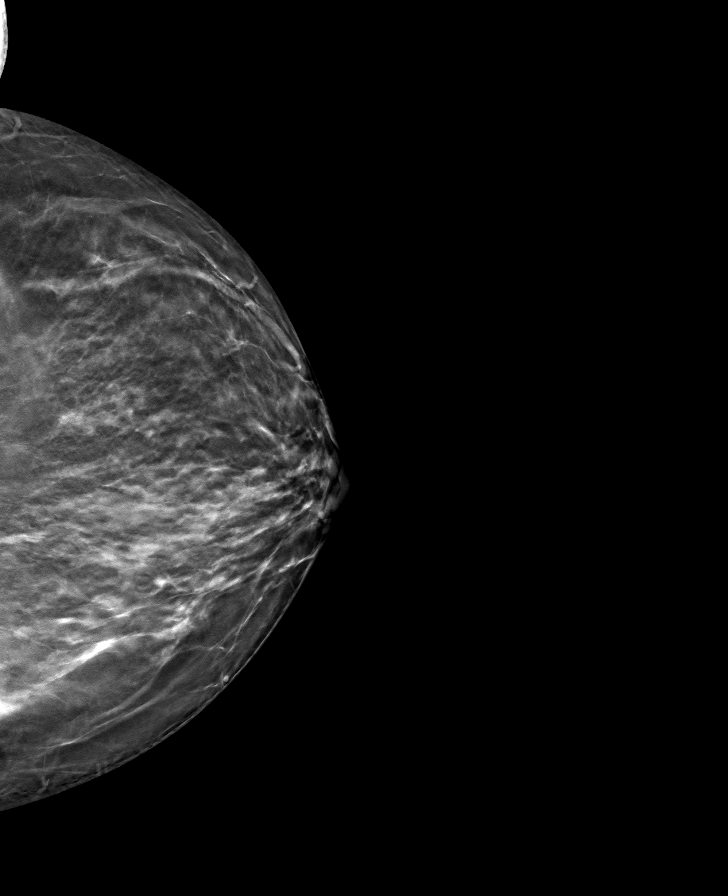

[8 of 24 positions shown; findings below may reference images not displayed]

ACR Breast Density Category c: The breast tissue is heterogeneously
dense, which may obscure small masses.
FINDINGS: There are no findings suspicious for malignancy.
IMPRESSION: No mammographic evidence of malignancy. A result letter of this
screening mammogram will be mailed directly to the patient.

RECOMMENDATION:
Screening mammogram in one year. (Code:Q3-W-BC3)

BI-RADS CATEGORY  1: Negative.

## 2023-02-03 DIAGNOSIS — H2513 Age-related nuclear cataract, bilateral: Secondary | ICD-10-CM | POA: Diagnosis not present

## 2023-02-03 DIAGNOSIS — H35372 Puckering of macula, left eye: Secondary | ICD-10-CM | POA: Diagnosis not present

## 2023-02-10 DIAGNOSIS — E049 Nontoxic goiter, unspecified: Secondary | ICD-10-CM | POA: Diagnosis not present

## 2023-02-10 DIAGNOSIS — I119 Hypertensive heart disease without heart failure: Secondary | ICD-10-CM | POA: Diagnosis not present

## 2023-02-10 DIAGNOSIS — E782 Mixed hyperlipidemia: Secondary | ICD-10-CM | POA: Diagnosis not present

## 2023-02-10 DIAGNOSIS — Z5181 Encounter for therapeutic drug level monitoring: Secondary | ICD-10-CM | POA: Diagnosis not present

## 2023-03-16 DIAGNOSIS — I119 Hypertensive heart disease without heart failure: Secondary | ICD-10-CM | POA: Diagnosis not present

## 2023-03-16 DIAGNOSIS — E782 Mixed hyperlipidemia: Secondary | ICD-10-CM | POA: Diagnosis not present

## 2023-03-16 DIAGNOSIS — E049 Nontoxic goiter, unspecified: Secondary | ICD-10-CM | POA: Diagnosis not present

## 2023-03-16 DIAGNOSIS — R002 Palpitations: Secondary | ICD-10-CM | POA: Diagnosis not present

## 2023-03-16 DIAGNOSIS — E559 Vitamin D deficiency, unspecified: Secondary | ICD-10-CM | POA: Diagnosis not present

## 2023-03-16 DIAGNOSIS — Z6827 Body mass index (BMI) 27.0-27.9, adult: Secondary | ICD-10-CM | POA: Diagnosis not present

## 2023-03-16 DIAGNOSIS — M5451 Vertebrogenic low back pain: Secondary | ICD-10-CM | POA: Diagnosis not present

## 2023-03-16 DIAGNOSIS — K219 Gastro-esophageal reflux disease without esophagitis: Secondary | ICD-10-CM | POA: Diagnosis not present

## 2023-03-16 DIAGNOSIS — F32A Depression, unspecified: Secondary | ICD-10-CM | POA: Diagnosis not present

## 2023-03-19 ENCOUNTER — Other Ambulatory Visit: Payer: Self-pay | Admitting: Internal Medicine

## 2023-03-19 DIAGNOSIS — E041 Nontoxic single thyroid nodule: Secondary | ICD-10-CM

## 2023-03-26 DIAGNOSIS — E782 Mixed hyperlipidemia: Secondary | ICD-10-CM | POA: Diagnosis not present

## 2023-03-26 DIAGNOSIS — J069 Acute upper respiratory infection, unspecified: Secondary | ICD-10-CM | POA: Diagnosis not present

## 2023-03-26 DIAGNOSIS — R059 Cough, unspecified: Secondary | ICD-10-CM | POA: Diagnosis not present

## 2023-03-26 DIAGNOSIS — R051 Acute cough: Secondary | ICD-10-CM | POA: Diagnosis not present

## 2023-03-26 DIAGNOSIS — J028 Acute pharyngitis due to other specified organisms: Secondary | ICD-10-CM | POA: Diagnosis not present

## 2023-03-26 DIAGNOSIS — I119 Hypertensive heart disease without heart failure: Secondary | ICD-10-CM | POA: Diagnosis not present

## 2023-03-26 DIAGNOSIS — R093 Abnormal sputum: Secondary | ICD-10-CM | POA: Diagnosis not present

## 2023-03-26 DIAGNOSIS — K130 Diseases of lips: Secondary | ICD-10-CM | POA: Diagnosis not present

## 2023-03-26 DIAGNOSIS — E049 Nontoxic goiter, unspecified: Secondary | ICD-10-CM | POA: Diagnosis not present

## 2023-03-26 DIAGNOSIS — R002 Palpitations: Secondary | ICD-10-CM | POA: Diagnosis not present

## 2023-03-26 DIAGNOSIS — Z139 Encounter for screening, unspecified: Secondary | ICD-10-CM | POA: Diagnosis not present

## 2023-03-26 DIAGNOSIS — E559 Vitamin D deficiency, unspecified: Secondary | ICD-10-CM | POA: Diagnosis not present

## 2023-03-26 DIAGNOSIS — F32A Depression, unspecified: Secondary | ICD-10-CM | POA: Diagnosis not present

## 2023-03-26 DIAGNOSIS — K219 Gastro-esophageal reflux disease without esophagitis: Secondary | ICD-10-CM | POA: Diagnosis not present

## 2023-03-26 DIAGNOSIS — Z1152 Encounter for screening for COVID-19: Secondary | ICD-10-CM | POA: Diagnosis not present

## 2023-04-14 DIAGNOSIS — M1711 Unilateral primary osteoarthritis, right knee: Secondary | ICD-10-CM | POA: Diagnosis not present

## 2023-04-21 DIAGNOSIS — M25561 Pain in right knee: Secondary | ICD-10-CM | POA: Diagnosis not present

## 2023-04-26 ENCOUNTER — Other Ambulatory Visit: Payer: Medicare PPO

## 2023-04-27 DIAGNOSIS — J028 Acute pharyngitis due to other specified organisms: Secondary | ICD-10-CM | POA: Diagnosis not present

## 2023-04-27 DIAGNOSIS — K130 Diseases of lips: Secondary | ICD-10-CM | POA: Diagnosis not present

## 2023-04-27 DIAGNOSIS — E049 Nontoxic goiter, unspecified: Secondary | ICD-10-CM | POA: Diagnosis not present

## 2023-04-27 DIAGNOSIS — M25561 Pain in right knee: Secondary | ICD-10-CM | POA: Diagnosis not present

## 2023-04-27 DIAGNOSIS — I119 Hypertensive heart disease without heart failure: Secondary | ICD-10-CM | POA: Diagnosis not present

## 2023-04-27 DIAGNOSIS — F32A Depression, unspecified: Secondary | ICD-10-CM | POA: Diagnosis not present

## 2023-04-27 DIAGNOSIS — K219 Gastro-esophageal reflux disease without esophagitis: Secondary | ICD-10-CM | POA: Diagnosis not present

## 2023-04-27 DIAGNOSIS — R002 Palpitations: Secondary | ICD-10-CM | POA: Diagnosis not present

## 2023-04-27 DIAGNOSIS — E559 Vitamin D deficiency, unspecified: Secondary | ICD-10-CM | POA: Diagnosis not present

## 2023-04-28 ENCOUNTER — Encounter: Payer: Self-pay | Admitting: Internal Medicine

## 2023-04-28 DIAGNOSIS — M1711 Unilateral primary osteoarthritis, right knee: Secondary | ICD-10-CM | POA: Diagnosis not present

## 2023-04-30 ENCOUNTER — Other Ambulatory Visit: Payer: Self-pay | Admitting: Internal Medicine

## 2023-04-30 ENCOUNTER — Ambulatory Visit
Admission: RE | Admit: 2023-04-30 | Discharge: 2023-04-30 | Disposition: A | Discharge status: Home / Self Care | Payer: Medicare PPO | Source: Ambulatory Visit | Attending: Internal Medicine | Admitting: Internal Medicine

## 2023-04-30 DIAGNOSIS — E041 Nontoxic single thyroid nodule: Secondary | ICD-10-CM

## 2023-05-20 DIAGNOSIS — M2241 Chondromalacia patellae, right knee: Secondary | ICD-10-CM | POA: Diagnosis not present

## 2023-05-20 DIAGNOSIS — S83281A Other tear of lateral meniscus, current injury, right knee, initial encounter: Secondary | ICD-10-CM | POA: Diagnosis not present

## 2023-05-20 DIAGNOSIS — M67461 Ganglion, right knee: Secondary | ICD-10-CM | POA: Diagnosis not present

## 2023-05-20 DIAGNOSIS — M6751 Plica syndrome, right knee: Secondary | ICD-10-CM | POA: Diagnosis not present

## 2023-05-20 DIAGNOSIS — S83241A Other tear of medial meniscus, current injury, right knee, initial encounter: Secondary | ICD-10-CM | POA: Diagnosis not present

## 2023-05-20 DIAGNOSIS — G8918 Other acute postprocedural pain: Secondary | ICD-10-CM | POA: Diagnosis not present

## 2023-05-25 DIAGNOSIS — M25661 Stiffness of right knee, not elsewhere classified: Secondary | ICD-10-CM | POA: Diagnosis not present

## 2023-05-25 DIAGNOSIS — M6281 Muscle weakness (generalized): Secondary | ICD-10-CM | POA: Diagnosis not present

## 2023-05-25 DIAGNOSIS — R269 Unspecified abnormalities of gait and mobility: Secondary | ICD-10-CM | POA: Diagnosis not present

## 2023-05-25 DIAGNOSIS — S83261D Peripheral tear of lateral meniscus, current injury, right knee, subsequent encounter: Secondary | ICD-10-CM | POA: Diagnosis not present

## 2023-05-25 DIAGNOSIS — S83231D Complex tear of medial meniscus, current injury, right knee, subsequent encounter: Secondary | ICD-10-CM | POA: Diagnosis not present

## 2023-06-02 DIAGNOSIS — S83231D Complex tear of medial meniscus, current injury, right knee, subsequent encounter: Secondary | ICD-10-CM | POA: Diagnosis not present

## 2023-06-02 DIAGNOSIS — M6281 Muscle weakness (generalized): Secondary | ICD-10-CM | POA: Diagnosis not present

## 2023-06-02 DIAGNOSIS — M25661 Stiffness of right knee, not elsewhere classified: Secondary | ICD-10-CM | POA: Diagnosis not present

## 2023-06-02 DIAGNOSIS — R269 Unspecified abnormalities of gait and mobility: Secondary | ICD-10-CM | POA: Diagnosis not present

## 2023-06-02 DIAGNOSIS — S83261D Peripheral tear of lateral meniscus, current injury, right knee, subsequent encounter: Secondary | ICD-10-CM | POA: Diagnosis not present

## 2023-06-04 DIAGNOSIS — R269 Unspecified abnormalities of gait and mobility: Secondary | ICD-10-CM | POA: Diagnosis not present

## 2023-06-04 DIAGNOSIS — M25661 Stiffness of right knee, not elsewhere classified: Secondary | ICD-10-CM | POA: Diagnosis not present

## 2023-06-04 DIAGNOSIS — S83261D Peripheral tear of lateral meniscus, current injury, right knee, subsequent encounter: Secondary | ICD-10-CM | POA: Diagnosis not present

## 2023-06-04 DIAGNOSIS — M6281 Muscle weakness (generalized): Secondary | ICD-10-CM | POA: Diagnosis not present

## 2023-06-04 DIAGNOSIS — S83231D Complex tear of medial meniscus, current injury, right knee, subsequent encounter: Secondary | ICD-10-CM | POA: Diagnosis not present

## 2023-06-09 DIAGNOSIS — R269 Unspecified abnormalities of gait and mobility: Secondary | ICD-10-CM | POA: Diagnosis not present

## 2023-06-09 DIAGNOSIS — S83261D Peripheral tear of lateral meniscus, current injury, right knee, subsequent encounter: Secondary | ICD-10-CM | POA: Diagnosis not present

## 2023-06-09 DIAGNOSIS — M25661 Stiffness of right knee, not elsewhere classified: Secondary | ICD-10-CM | POA: Diagnosis not present

## 2023-06-09 DIAGNOSIS — S83231D Complex tear of medial meniscus, current injury, right knee, subsequent encounter: Secondary | ICD-10-CM | POA: Diagnosis not present

## 2023-06-09 DIAGNOSIS — M6281 Muscle weakness (generalized): Secondary | ICD-10-CM | POA: Diagnosis not present

## 2023-06-11 DIAGNOSIS — M25661 Stiffness of right knee, not elsewhere classified: Secondary | ICD-10-CM | POA: Diagnosis not present

## 2023-06-11 DIAGNOSIS — S83231D Complex tear of medial meniscus, current injury, right knee, subsequent encounter: Secondary | ICD-10-CM | POA: Diagnosis not present

## 2023-06-11 DIAGNOSIS — R269 Unspecified abnormalities of gait and mobility: Secondary | ICD-10-CM | POA: Diagnosis not present

## 2023-06-11 DIAGNOSIS — M6281 Muscle weakness (generalized): Secondary | ICD-10-CM | POA: Diagnosis not present

## 2023-06-11 DIAGNOSIS — S83261D Peripheral tear of lateral meniscus, current injury, right knee, subsequent encounter: Secondary | ICD-10-CM | POA: Diagnosis not present

## 2023-06-16 DIAGNOSIS — R269 Unspecified abnormalities of gait and mobility: Secondary | ICD-10-CM | POA: Diagnosis not present

## 2023-06-16 DIAGNOSIS — S83231D Complex tear of medial meniscus, current injury, right knee, subsequent encounter: Secondary | ICD-10-CM | POA: Diagnosis not present

## 2023-06-16 DIAGNOSIS — M25661 Stiffness of right knee, not elsewhere classified: Secondary | ICD-10-CM | POA: Diagnosis not present

## 2023-06-16 DIAGNOSIS — M6281 Muscle weakness (generalized): Secondary | ICD-10-CM | POA: Diagnosis not present

## 2023-06-16 DIAGNOSIS — S83261D Peripheral tear of lateral meniscus, current injury, right knee, subsequent encounter: Secondary | ICD-10-CM | POA: Diagnosis not present

## 2023-06-22 DIAGNOSIS — R269 Unspecified abnormalities of gait and mobility: Secondary | ICD-10-CM | POA: Diagnosis not present

## 2023-06-22 DIAGNOSIS — S83261D Peripheral tear of lateral meniscus, current injury, right knee, subsequent encounter: Secondary | ICD-10-CM | POA: Diagnosis not present

## 2023-06-22 DIAGNOSIS — M6281 Muscle weakness (generalized): Secondary | ICD-10-CM | POA: Diagnosis not present

## 2023-06-22 DIAGNOSIS — M25661 Stiffness of right knee, not elsewhere classified: Secondary | ICD-10-CM | POA: Diagnosis not present

## 2023-06-22 DIAGNOSIS — S83231D Complex tear of medial meniscus, current injury, right knee, subsequent encounter: Secondary | ICD-10-CM | POA: Diagnosis not present

## 2023-06-28 DIAGNOSIS — S83231D Complex tear of medial meniscus, current injury, right knee, subsequent encounter: Secondary | ICD-10-CM | POA: Diagnosis not present

## 2023-06-30 DIAGNOSIS — R269 Unspecified abnormalities of gait and mobility: Secondary | ICD-10-CM | POA: Diagnosis not present

## 2023-06-30 DIAGNOSIS — M25661 Stiffness of right knee, not elsewhere classified: Secondary | ICD-10-CM | POA: Diagnosis not present

## 2023-06-30 DIAGNOSIS — M6281 Muscle weakness (generalized): Secondary | ICD-10-CM | POA: Diagnosis not present

## 2023-06-30 DIAGNOSIS — S83261D Peripheral tear of lateral meniscus, current injury, right knee, subsequent encounter: Secondary | ICD-10-CM | POA: Diagnosis not present

## 2023-06-30 DIAGNOSIS — S83231D Complex tear of medial meniscus, current injury, right knee, subsequent encounter: Secondary | ICD-10-CM | POA: Diagnosis not present

## 2023-07-06 DIAGNOSIS — S83261D Peripheral tear of lateral meniscus, current injury, right knee, subsequent encounter: Secondary | ICD-10-CM | POA: Diagnosis not present

## 2023-07-06 DIAGNOSIS — S83231D Complex tear of medial meniscus, current injury, right knee, subsequent encounter: Secondary | ICD-10-CM | POA: Diagnosis not present

## 2023-07-06 DIAGNOSIS — R269 Unspecified abnormalities of gait and mobility: Secondary | ICD-10-CM | POA: Diagnosis not present

## 2023-07-06 DIAGNOSIS — M25661 Stiffness of right knee, not elsewhere classified: Secondary | ICD-10-CM | POA: Diagnosis not present

## 2023-07-06 DIAGNOSIS — M6281 Muscle weakness (generalized): Secondary | ICD-10-CM | POA: Diagnosis not present

## 2023-07-13 DIAGNOSIS — E782 Mixed hyperlipidemia: Secondary | ICD-10-CM | POA: Diagnosis not present

## 2023-07-13 DIAGNOSIS — R7303 Prediabetes: Secondary | ICD-10-CM | POA: Diagnosis not present

## 2023-07-13 DIAGNOSIS — Z9889 Other specified postprocedural states: Secondary | ICD-10-CM | POA: Diagnosis not present

## 2023-07-13 DIAGNOSIS — J028 Acute pharyngitis due to other specified organisms: Secondary | ICD-10-CM | POA: Diagnosis not present

## 2023-07-13 DIAGNOSIS — E559 Vitamin D deficiency, unspecified: Secondary | ICD-10-CM | POA: Diagnosis not present

## 2023-07-13 DIAGNOSIS — M25561 Pain in right knee: Secondary | ICD-10-CM | POA: Diagnosis not present

## 2023-07-13 DIAGNOSIS — F32A Depression, unspecified: Secondary | ICD-10-CM | POA: Diagnosis not present

## 2023-07-13 DIAGNOSIS — R718 Other abnormality of red blood cells: Secondary | ICD-10-CM | POA: Diagnosis not present

## 2023-07-13 DIAGNOSIS — I119 Hypertensive heart disease without heart failure: Secondary | ICD-10-CM | POA: Diagnosis not present

## 2023-07-13 DIAGNOSIS — E049 Nontoxic goiter, unspecified: Secondary | ICD-10-CM | POA: Diagnosis not present

## 2023-07-13 DIAGNOSIS — K219 Gastro-esophageal reflux disease without esophagitis: Secondary | ICD-10-CM | POA: Diagnosis not present

## 2023-07-13 DIAGNOSIS — R002 Palpitations: Secondary | ICD-10-CM | POA: Diagnosis not present

## 2023-07-14 ENCOUNTER — Other Ambulatory Visit: Payer: Self-pay | Admitting: Internal Medicine

## 2023-07-14 DIAGNOSIS — S83261D Peripheral tear of lateral meniscus, current injury, right knee, subsequent encounter: Secondary | ICD-10-CM | POA: Diagnosis not present

## 2023-07-14 DIAGNOSIS — S83231D Complex tear of medial meniscus, current injury, right knee, subsequent encounter: Secondary | ICD-10-CM | POA: Diagnosis not present

## 2023-07-14 DIAGNOSIS — R269 Unspecified abnormalities of gait and mobility: Secondary | ICD-10-CM | POA: Diagnosis not present

## 2023-07-14 DIAGNOSIS — M6281 Muscle weakness (generalized): Secondary | ICD-10-CM | POA: Diagnosis not present

## 2023-07-14 DIAGNOSIS — M25661 Stiffness of right knee, not elsewhere classified: Secondary | ICD-10-CM | POA: Diagnosis not present

## 2023-07-14 DIAGNOSIS — Z1231 Encounter for screening mammogram for malignant neoplasm of breast: Secondary | ICD-10-CM

## 2023-07-21 DIAGNOSIS — S83261D Peripheral tear of lateral meniscus, current injury, right knee, subsequent encounter: Secondary | ICD-10-CM | POA: Diagnosis not present

## 2023-07-21 DIAGNOSIS — S83231D Complex tear of medial meniscus, current injury, right knee, subsequent encounter: Secondary | ICD-10-CM | POA: Diagnosis not present

## 2023-07-21 DIAGNOSIS — R269 Unspecified abnormalities of gait and mobility: Secondary | ICD-10-CM | POA: Diagnosis not present

## 2023-07-21 DIAGNOSIS — M25661 Stiffness of right knee, not elsewhere classified: Secondary | ICD-10-CM | POA: Diagnosis not present

## 2023-07-21 DIAGNOSIS — M6281 Muscle weakness (generalized): Secondary | ICD-10-CM | POA: Diagnosis not present

## 2023-08-23 DIAGNOSIS — M1711 Unilateral primary osteoarthritis, right knee: Secondary | ICD-10-CM | POA: Diagnosis not present

## 2023-08-26 DIAGNOSIS — E559 Vitamin D deficiency, unspecified: Secondary | ICD-10-CM | POA: Diagnosis not present

## 2023-08-26 DIAGNOSIS — F320 Major depressive disorder, single episode, mild: Secondary | ICD-10-CM | POA: Diagnosis not present

## 2023-08-26 DIAGNOSIS — M25561 Pain in right knee: Secondary | ICD-10-CM | POA: Diagnosis not present

## 2023-08-26 DIAGNOSIS — K2100 Gastro-esophageal reflux disease with esophagitis, without bleeding: Secondary | ICD-10-CM | POA: Diagnosis not present

## 2023-08-26 DIAGNOSIS — I119 Hypertensive heart disease without heart failure: Secondary | ICD-10-CM | POA: Diagnosis not present

## 2023-08-26 DIAGNOSIS — M5451 Vertebrogenic low back pain: Secondary | ICD-10-CM | POA: Diagnosis not present

## 2023-08-26 DIAGNOSIS — E049 Nontoxic goiter, unspecified: Secondary | ICD-10-CM | POA: Diagnosis not present

## 2023-08-26 DIAGNOSIS — Z0001 Encounter for general adult medical examination with abnormal findings: Secondary | ICD-10-CM | POA: Diagnosis not present

## 2023-08-26 DIAGNOSIS — E782 Mixed hyperlipidemia: Secondary | ICD-10-CM | POA: Diagnosis not present

## 2023-08-31 ENCOUNTER — Ambulatory Visit
Admission: RE | Admit: 2023-08-31 | Discharge: 2023-08-31 | Disposition: A | Discharge status: Home / Self Care | Source: Ambulatory Visit | Attending: Internal Medicine | Admitting: Internal Medicine

## 2023-08-31 DIAGNOSIS — Z1231 Encounter for screening mammogram for malignant neoplasm of breast: Secondary | ICD-10-CM | POA: Diagnosis not present

## 2023-09-16 DIAGNOSIS — N950 Postmenopausal bleeding: Secondary | ICD-10-CM | POA: Diagnosis not present

## 2023-09-22 DIAGNOSIS — N950 Postmenopausal bleeding: Secondary | ICD-10-CM | POA: Diagnosis not present

## 2023-10-20 DIAGNOSIS — R7303 Prediabetes: Secondary | ICD-10-CM | POA: Diagnosis not present

## 2023-10-20 DIAGNOSIS — E782 Mixed hyperlipidemia: Secondary | ICD-10-CM | POA: Diagnosis not present

## 2023-10-20 DIAGNOSIS — E049 Nontoxic goiter, unspecified: Secondary | ICD-10-CM | POA: Diagnosis not present

## 2023-10-20 DIAGNOSIS — I119 Hypertensive heart disease without heart failure: Secondary | ICD-10-CM | POA: Diagnosis not present

## 2023-11-03 ENCOUNTER — Encounter: Payer: Self-pay | Admitting: *Deleted

## 2023-11-03 NOTE — Progress Notes (Signed)
 [name]                                          MRN: [id number]   11/03/2023   The [facility] Quality Team Specialist reviewed this patient medical record for the purposes of chart review for care gap closure. The following were reviewed: chart review for care gap closure-controlling blood pressure.    [facility] Quality Team

## 2023-11-15 DIAGNOSIS — N950 Postmenopausal bleeding: Secondary | ICD-10-CM | POA: Diagnosis not present

## 2023-11-23 DIAGNOSIS — Z0001 Encounter for general adult medical examination with abnormal findings: Secondary | ICD-10-CM | POA: Diagnosis not present

## 2023-11-23 DIAGNOSIS — E782 Mixed hyperlipidemia: Secondary | ICD-10-CM | POA: Diagnosis not present

## 2023-11-23 DIAGNOSIS — B349 Viral infection, unspecified: Secondary | ICD-10-CM | POA: Diagnosis not present

## 2023-11-23 DIAGNOSIS — M6281 Muscle weakness (generalized): Secondary | ICD-10-CM | POA: Diagnosis not present

## 2023-11-23 DIAGNOSIS — Z13828 Encounter for screening for other musculoskeletal disorder: Secondary | ICD-10-CM | POA: Diagnosis not present

## 2023-11-23 DIAGNOSIS — E049 Nontoxic goiter, unspecified: Secondary | ICD-10-CM | POA: Diagnosis not present

## 2023-11-23 DIAGNOSIS — M3500 Sicca syndrome, unspecified: Secondary | ICD-10-CM | POA: Diagnosis not present

## 2023-11-23 DIAGNOSIS — F320 Major depressive disorder, single episode, mild: Secondary | ICD-10-CM | POA: Diagnosis not present

## 2023-11-23 DIAGNOSIS — Z23 Encounter for immunization: Secondary | ICD-10-CM | POA: Diagnosis not present

## 2023-11-23 DIAGNOSIS — Z1159 Encounter for screening for other viral diseases: Secondary | ICD-10-CM | POA: Diagnosis not present

## 2023-11-23 DIAGNOSIS — M5451 Vertebrogenic low back pain: Secondary | ICD-10-CM | POA: Diagnosis not present

## 2023-11-23 DIAGNOSIS — I119 Hypertensive heart disease without heart failure: Secondary | ICD-10-CM | POA: Diagnosis not present

## 2023-11-23 DIAGNOSIS — E559 Vitamin D deficiency, unspecified: Secondary | ICD-10-CM | POA: Diagnosis not present

## 2023-11-23 DIAGNOSIS — M25561 Pain in right knee: Secondary | ICD-10-CM | POA: Diagnosis not present

## 2023-11-23 DIAGNOSIS — Z0184 Encounter for antibody response examination: Secondary | ICD-10-CM | POA: Diagnosis not present

## 2023-11-23 DIAGNOSIS — K2100 Gastro-esophageal reflux disease with esophagitis, without bleeding: Secondary | ICD-10-CM | POA: Diagnosis not present

## 2023-12-07 DIAGNOSIS — F320 Major depressive disorder, single episode, mild: Secondary | ICD-10-CM | POA: Diagnosis not present

## 2023-12-07 DIAGNOSIS — R760 Raised antibody titer: Secondary | ICD-10-CM | POA: Diagnosis not present

## 2023-12-07 DIAGNOSIS — E782 Mixed hyperlipidemia: Secondary | ICD-10-CM | POA: Diagnosis not present

## 2023-12-07 DIAGNOSIS — B3783 Candidal cheilitis: Secondary | ICD-10-CM | POA: Diagnosis not present

## 2023-12-07 DIAGNOSIS — B009 Herpesviral infection, unspecified: Secondary | ICD-10-CM | POA: Diagnosis not present

## 2023-12-07 DIAGNOSIS — E559 Vitamin D deficiency, unspecified: Secondary | ICD-10-CM | POA: Diagnosis not present

## 2023-12-07 DIAGNOSIS — Z23 Encounter for immunization: Secondary | ICD-10-CM | POA: Diagnosis not present

## 2023-12-07 DIAGNOSIS — K2100 Gastro-esophageal reflux disease with esophagitis, without bleeding: Secondary | ICD-10-CM | POA: Diagnosis not present

## 2023-12-07 DIAGNOSIS — E049 Nontoxic goiter, unspecified: Secondary | ICD-10-CM | POA: Diagnosis not present

## 2024-05-25 ENCOUNTER — Ambulatory Visit: Admitting: Cardiology
